# Patient Record
Sex: Female | Born: 1985 | Race: Black or African American | Hispanic: No | Marital: Single | Smoking: Never smoker
Health system: Southern US, Community
[De-identification: ages and names within clinical notes are randomized; demographics above are authoritative.]

## PROBLEM LIST (undated history)

## (undated) ENCOUNTER — Inpatient Hospital Stay (HOSPITAL_COMMUNITY): Payer: Self-pay

## (undated) DIAGNOSIS — E119 Type 2 diabetes mellitus without complications: Secondary | ICD-10-CM

## (undated) DIAGNOSIS — Z8619 Personal history of other infectious and parasitic diseases: Secondary | ICD-10-CM

## (undated) HISTORY — DX: Personal history of other infectious and parasitic diseases: Z86.19

## (undated) HISTORY — PX: THROAT SURGERY: SHX803

## (undated) HISTORY — DX: Type 2 diabetes mellitus without complications: E11.9

---
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
## 2006-03-05 ENCOUNTER — Emergency Department (HOSPITAL_COMMUNITY): Admission: EM | Admit: 2006-03-05 | Discharge: 2006-03-05 | Payer: Self-pay | Admitting: Emergency Medicine

## 2007-04-03 IMAGING — US US OB COMP LESS 14 WK
1 series · 13 of 28 positions shown · non-contrast
Comparison: none

HISTORY: Early pregnancy, pain and bleeding, no quantitative beta-hCG level for
correlation

[Series 1: us ob comp less 14 wk · 0.24mm/px · 13 of 31 slices shown]
[im 2/31]
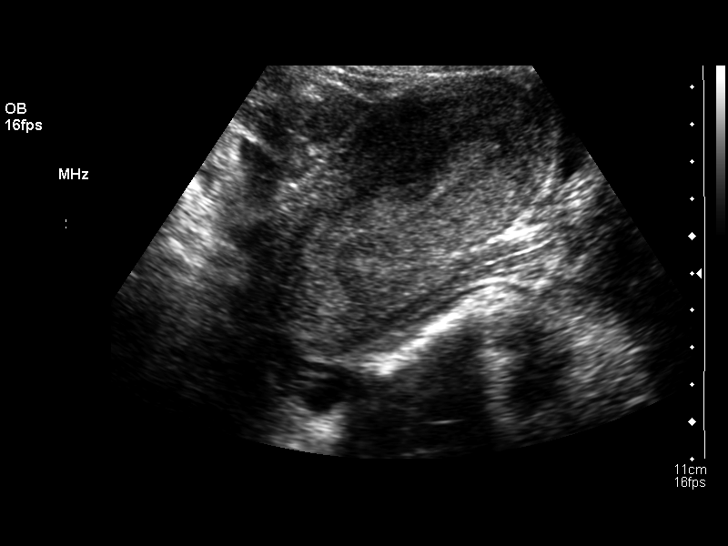
[im 4/31]
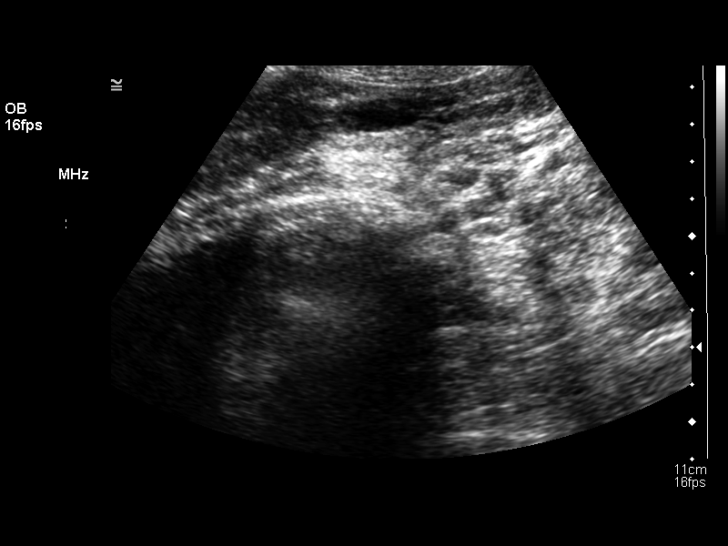
[im 6/31]
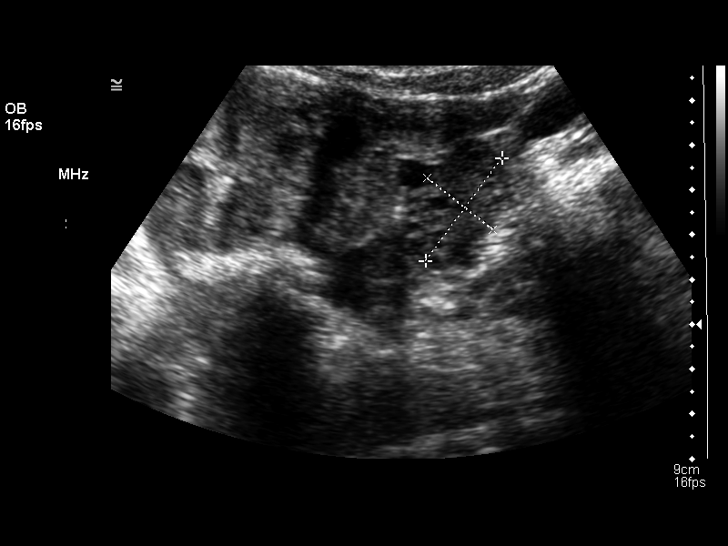
[im 8/31]
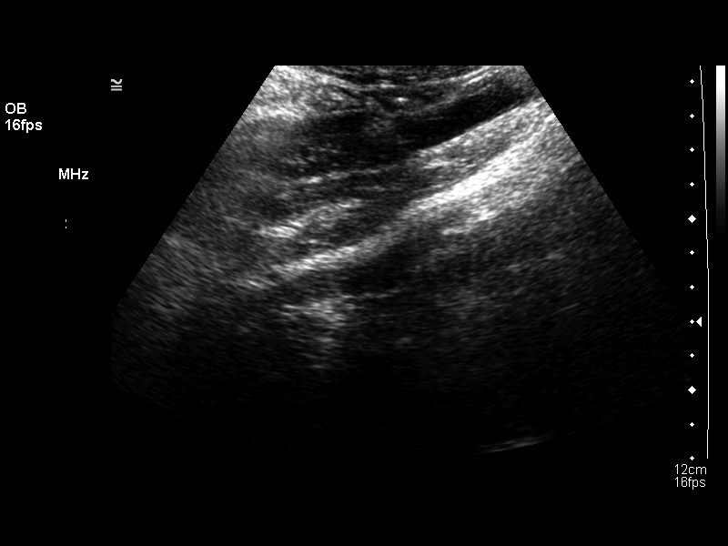
[im 11/31]
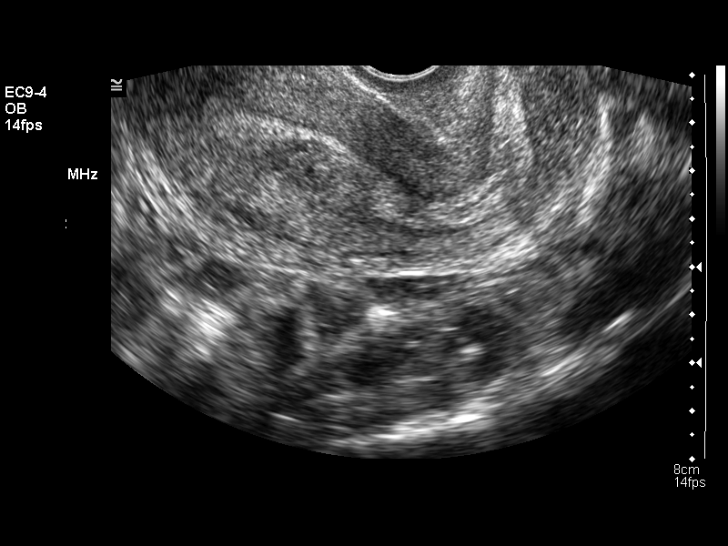
[im 13/31]
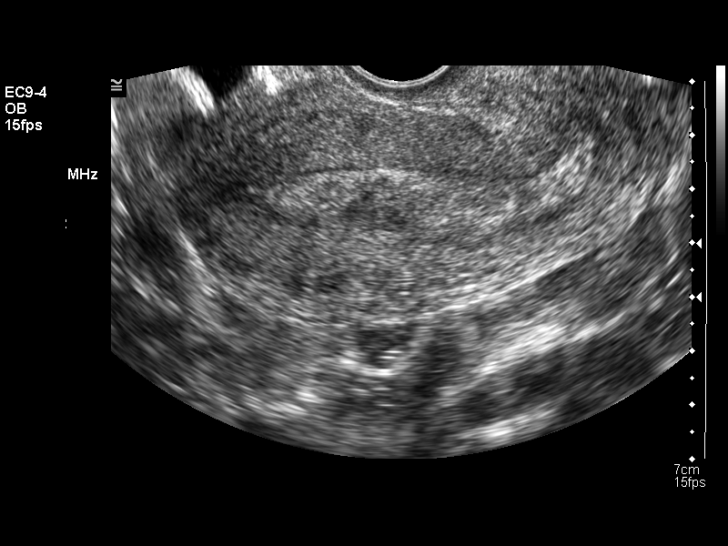
[im 16/31]
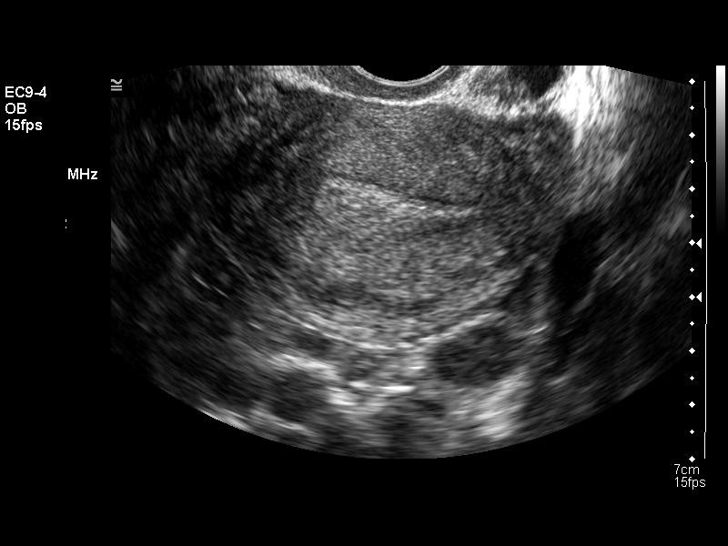
[im 18/31]
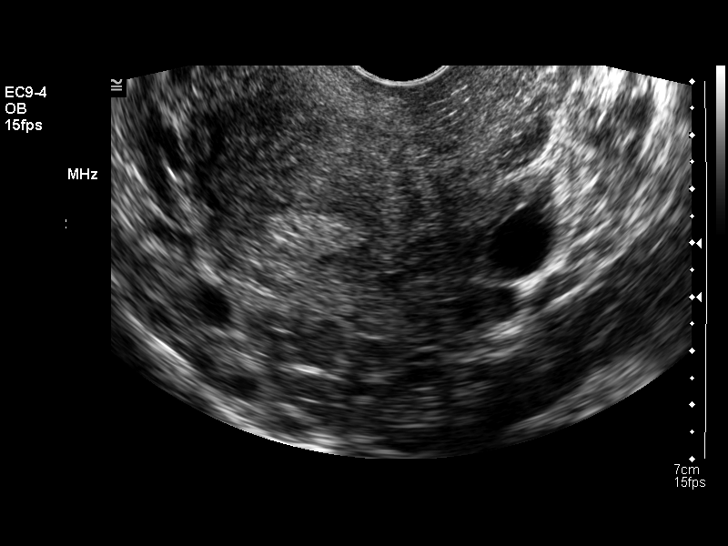
[im 21/31]
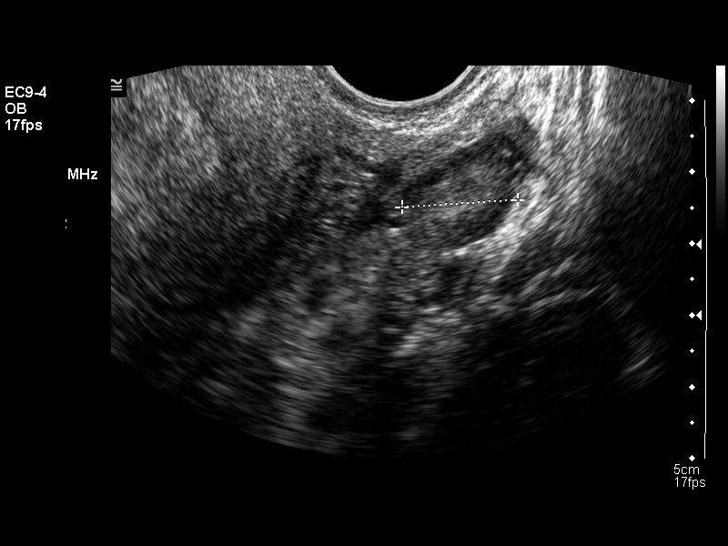
[im 23/31]
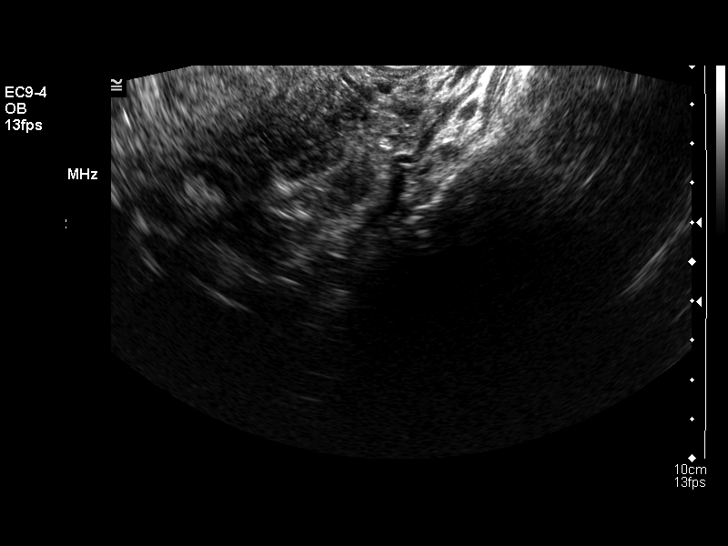
[im 25/31]
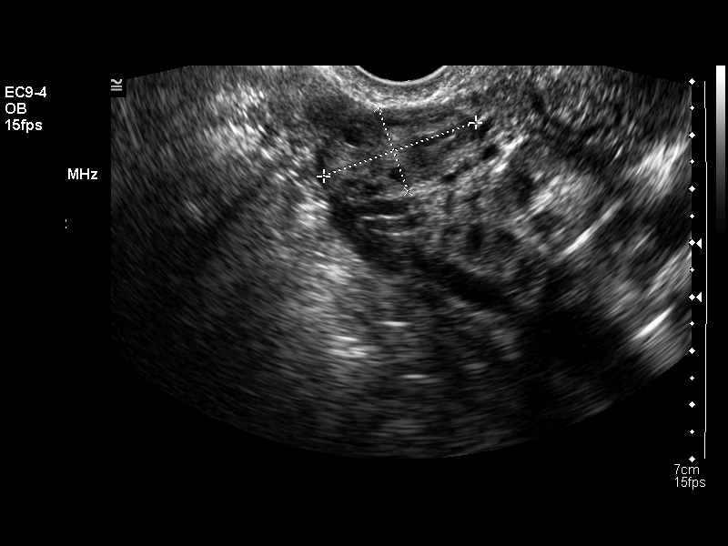
[im 27/31]
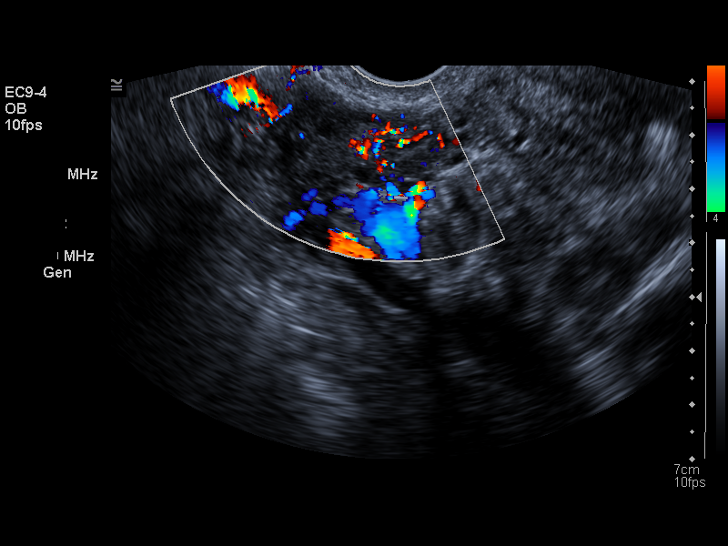
[im 29/31]
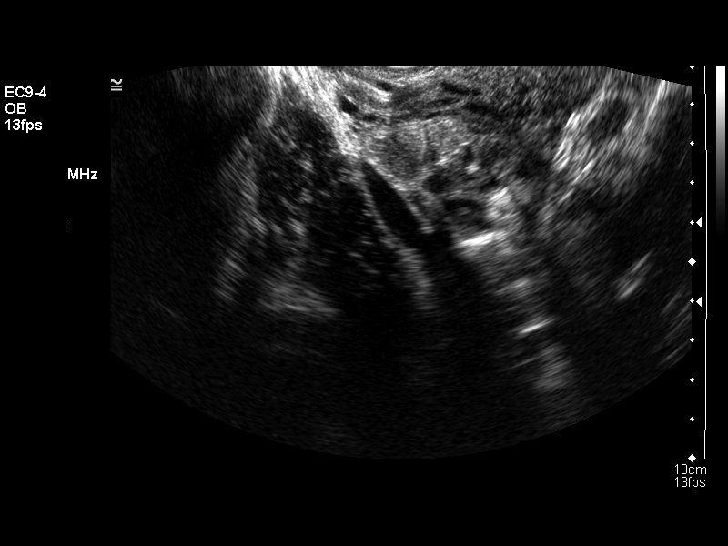

[13 of 28 positions shown; findings below may reference images not displayed]

ULTRASOUND OB COMPLETE LESS THAN 14 WEEKS:
ULTRASOUND OF THE TRANSVAGINAL MODIFY:

Transabdominal and endovaginal sonography of the gravid pelvis performed.

Uterus anteverted.
Uterine length uncertain with AP diameter 4.5 cm and transverse diameter 6.8 cm.
Endometrial stripe thickened, approximately 12 mm in greatest diameter.
Endometrial stripe appears heterogeneous and contains a hypoechoic focus at its
midportion, question small amount of blood.
No evidence of uterine mass or intrauterine gestation.

Small amount of nonspecific free pelvic fluid.
Left ovary normal size and morphology, 2.9 x 1.2 x 1.6 cm.
Right ovary normal size and morphology, 3.0 x 1.7 x 1.9 cm.
No adnexal masses.
IMPRESSION: Slight thickened and heterogeneous endometrial stripe, questionably containing a
small amount of blood.
No intrauterine or extrauterine gestational sac identified.
In the absence of a quantitative beta-hCG level, findings are nonspecific.
Differential diagnosis includes early intrauterine pregnancy too early to
visualize, spontaneous abortion, and ectopic pregnancy.
Recommend correlation with a quantitative beta-hCG level at this time.
Serial quantitative beta-hCG and or followup ultrasound recommended to exclude
ectopic pregnancy.

## 2007-12-20 ENCOUNTER — Ambulatory Visit: Payer: Self-pay | Admitting: Obstetrics & Gynecology

## 2007-12-20 ENCOUNTER — Ambulatory Visit (HOSPITAL_COMMUNITY): Admission: RE | Admit: 2007-12-20 | Discharge: 2007-12-20 | Payer: Self-pay | Admitting: Family Medicine

## 2007-12-20 ENCOUNTER — Encounter: Payer: Self-pay | Admitting: Family

## 2007-12-20 ENCOUNTER — Other Ambulatory Visit: Payer: Self-pay | Admitting: Obstetrics & Gynecology

## 2007-12-26 ENCOUNTER — Ambulatory Visit: Payer: Self-pay | Admitting: Obstetrics & Gynecology

## 2008-01-03 ENCOUNTER — Ambulatory Visit: Payer: Self-pay | Admitting: Obstetrics & Gynecology

## 2008-01-17 ENCOUNTER — Ambulatory Visit: Payer: Self-pay | Admitting: Obstetrics & Gynecology

## 2008-01-31 ENCOUNTER — Ambulatory Visit: Payer: Self-pay | Admitting: Obstetrics & Gynecology

## 2008-02-14 ENCOUNTER — Ambulatory Visit: Payer: Self-pay | Admitting: Obstetrics & Gynecology

## 2008-03-06 ENCOUNTER — Ambulatory Visit: Payer: Self-pay | Admitting: Obstetrics & Gynecology

## 2008-03-13 ENCOUNTER — Ambulatory Visit: Payer: Self-pay | Admitting: Obstetrics & Gynecology

## 2008-03-16 ENCOUNTER — Ambulatory Visit: Payer: Self-pay | Admitting: Advanced Practice Midwife

## 2008-03-16 ENCOUNTER — Inpatient Hospital Stay (HOSPITAL_COMMUNITY): Admission: AD | Admit: 2008-03-16 | Discharge: 2008-03-16 | Payer: Self-pay | Admitting: Family Medicine

## 2008-03-20 ENCOUNTER — Ambulatory Visit: Payer: Self-pay | Admitting: Obstetrics & Gynecology

## 2008-03-20 ENCOUNTER — Inpatient Hospital Stay (HOSPITAL_COMMUNITY): Admission: AD | Admit: 2008-03-20 | Discharge: 2008-03-23 | Payer: Self-pay | Admitting: Obstetrics & Gynecology

## 2008-03-20 ENCOUNTER — Ambulatory Visit: Payer: Self-pay | Admitting: Family

## 2010-07-04 ENCOUNTER — Emergency Department (HOSPITAL_COMMUNITY)
Admission: EM | Admit: 2010-07-04 | Discharge: 2010-07-04 | Payer: Self-pay | Source: Home / Self Care | Admitting: Emergency Medicine

## 2010-07-09 ENCOUNTER — Emergency Department (HOSPITAL_COMMUNITY)
Admission: EM | Admit: 2010-07-09 | Discharge: 2010-07-09 | Payer: Self-pay | Source: Home / Self Care | Admitting: Emergency Medicine

## 2010-07-13 LAB — ETHANOL: Alcohol, Ethyl (B): <5 mg/dL (ref 0–10)

## 2010-07-13 LAB — BASIC METABOLIC PANEL
BUN: 8 mg/dL (ref 6–23)
CO2: 24 mEq/L (ref 19–32)
Calcium: 9.1 mg/dL (ref 8.4–10.5)
Chloride: 104 mEq/L (ref 96–112)
Creatinine, Ser: 0.68 mg/dL (ref 0.4–1.2)
GFR calc Af Amer: >60 mL/min (ref >60)
GFR calc non Af Amer: >60 mL/min (ref >60)
Glucose, Bld: 97 mg/dL (ref 70–99)
Potassium: 3.8 mEq/L (ref 3.5–5.1)
Sodium: 136 mEq/L (ref 135–145)

## 2010-07-13 LAB — URINE CULTURE
Colony Count: >=100000
Culture  Setup Time: 201201080026

## 2010-07-13 LAB — DIFFERENTIAL
Basophils Absolute: 0 10*3/uL (ref 0.0–0.1)
Basophils Relative: 0 % (ref 0–1)
Eosinophils Absolute: 0 10*3/uL (ref 0.0–0.7)
Eosinophils Relative: 0 % (ref 0–5)
Lymphocytes Relative: 15 % (ref 12–46)
Lymphs Abs: 1.3 10*3/uL (ref 0.7–4.0)
Monocytes Absolute: 1.1 10*3/uL — ABNORMAL HIGH (ref 0.1–1.0)
Monocytes Relative: 13 % — ABNORMAL HIGH (ref 3–12)
Neutro Abs: 6.2 10*3/uL (ref 1.7–7.7)
Neutrophils Relative %: 71 % (ref 43–77)

## 2010-07-13 LAB — URINALYSIS, ROUTINE W REFLEX MICROSCOPIC
Bilirubin Urine: NEGATIVE
Ketones, ur: NEGATIVE mg/dL
Nitrite: NEGATIVE
Protein, ur: NEGATIVE mg/dL
Specific Gravity, Urine: 1.022 (ref 1.005–1.030)
Urine Glucose, Fasting: NEGATIVE mg/dL
Urobilinogen, UA: 1 mg/dL (ref 0.0–1.0)
pH: 7 (ref 5.0–8.0)

## 2010-07-13 LAB — CBC
HCT: 41.4 % (ref 36.0–46.0)
Hemoglobin: 13.7 g/dL (ref 12.0–15.0)
MCH: 28 pg (ref 26.0–34.0)
MCHC: 33.1 g/dL (ref 30.0–36.0)
MCV: 84.7 fL (ref 78.0–100.0)
Platelets: 325 10*3/uL (ref 150–400)
RBC: 4.89 MIL/uL (ref 3.87–5.11)
RDW: 13.1 % (ref 11.5–15.5)
WBC: 8.7 10*3/uL (ref 4.0–10.5)

## 2010-07-13 LAB — URINE MICROSCOPIC-ADD ON

## 2010-07-13 LAB — PREGNANCY, URINE: Preg Test, Ur: NEGATIVE

## 2010-11-13 NOTE — Op Note (Signed)
NAMECLATIE, Alison Stone             ACCOUNT NO.:  [id number]   MEDICAL RECORD NO.:  [id number]          PATIENT TYPE:  AMB   LOCATION:  SDS                          FACILITY:  [facility]   PHYSICIAN:  Antony Contras, MD     DATE OF BIRTH:  [birth year]   DATE OF PROCEDURE:  09/30/2005  DATE OF DISCHARGE:  09/30/2005                                 OPERATIVE REPORT   PREOPERATIVE DIAGNOSIS:  Right peritonsillar abscess.   POSTOP DIAGNOSIS:  Right peritonsillar abscess.   PROCEDURE:  Incision and drainage of right peritonsillar abscess.   SURGEON:  Dr. Christia Reading   ANESTHESIA:  General endotracheal anesthesia.   COMPLICATIONS:  None.   INDICATIONS:  The patient is a 25 year old African-American female with a 6-  day history of sore throat who presented to the emergency department this  morning.  A CT scan was performed due to limited exam and demonstrated a  right peritonsillar fluid collection.  She has a history of previous right  peritonsillar infection requiring I and D two years ago.  After discussing  the options with her and her mother including incision and drainage versus  Quinsy tonsillectomy, she is to undergo incision and drainage due to the  longer recovery with tonsillectomy.  She plans to have tonsils removed in  the future.  Incision and drainage was attempted emergency department the  patient did not tolerate this due to trismus and pain.  She thus presents to  the operating room for surgical management.   FINDINGS:  The right peritonsillar region was markedly full with the pushing  of the uvula toward the left side.  Upon injection of local anesthetic,  cheesy yellow material came from the superior tonsillar pole.  Upon incision  and dissection, a pocket of the yellow pus was entered and drained.   DESCRIPTION OF PROCEDURE:  The patient is identified in the holding room and  informed consent having been obtained, the patient was moved to the  operating suite and  put on the operating table in supine position.  Anesthesia was induced.  The patient was intubated by the anesthesia team  without difficulty.  The patient given intravenous antibiotics, steroids  during the case.  The eyes were taped closed and bed was turned 90 degrees  from anesthesia.  The Crowe-Davis retractor was inserted to view the  oropharynx and this was placed in suspension on the Mayo stand.  The mucosa  superior to the right tonsil was injected with 1% lidocaine with 1:100,000  epinephrine.  An arcing incision was then made above the tonsil using  electrocautery the mucosa and submucosal tissues.  Hemostat was then used to  dissect in the deeper tissues and the pus pocket was entered posterior to  the tonsil.  The pus was drained out and then pressed out by pressing on the  tonsil.  The site was then copiously irrigated with saline and the  mouth and esophagus were suctioned out.  The Crowe-Davis retractor then  taken out of suspension and removed from the patient's mouth.  She was then  turned back to Anesthesia  for wake-up was extubated and moved to recovery  room in stable condition.      Antony Contras, MD  Electronically Signed     DDB/MEDQ  D:  09/30/2005  T:  10/01/2005  Job:  [id number]

## 2010-11-13 NOTE — Consult Note (Signed)
NAME:  Alison Stone, Alison Stone                       ACCOUNT NO.:  [id number]   MEDICAL RECORD NO.:  [id number]                   PATIENT TYPE:  EMS   LOCATION:  ED                                   FACILITY:  [facility]   PHYSICIAN:  Jefry H. Pollyann Kennedy, M.D.                DATE OF BIRTH:  06/06/1986   DATE OF CONSULTATION:  DATE OF DISCHARGE:  08/05/2003                                   CONSULTATION   OTORHINOLARYNGOLOGY CONSULTATION:   REASON FOR CONSULTATION:  Peritonsillar abscess.   HISTORY:  This is a 25 year old female who has a 10-day history of severe  progressively worsening sore throat that has localized to the right side.  No prior history of tonsillar disease.  She has no medical history, no  surgical history, takes no medication, has no known drug allergies, does not  smoke or use alcohol or drugs.  She is currently a high Ecologist.  No  primary care physician of record.   EXAMINATION:  She is an ill-appearing young lady right now somewhat sleepy  secondary to a dose of Dilaudid that was administered for pain relief.  There is some tender right upper anterior jugular adenopathy.  The nasal  exam is clear.  Oral cavity and pharynx reveals full and healthy dentition,  significant trismus.  The right soft palate is severely swollen and with  erythema.  There is no exudate seen.  The tonsils are very difficult to  visualize.   Clinical history and exam suspicious for right peritonsillar abscess.   PROCEDURE NOTE:  One percent Xylocaine with epinephrine was infiltrated into  the right soft palate.  An 18-guage needle was used to aspirate from the  peritonsillar space a large amount of thick slightly brownish-tan colored  purulent material.  A 3 mL syringe was filled with pus and then emptied and  partially filled again.  A #15 scalpel was used to the incise the mucosa and  a large amount of pus was then expressed.  A long tonsillar hemostat was  used to spread the peritonsillar  tissue and further purulence was produced.  Suction was used to clean out all of the secretions.  There was minimal  bleeding.  She tolerated this well.   Status post incision and drainage of the right peritonsillar abscess.  She  will be administered 1 L of intravenous fluids for rehydration.  She will be  given 1.2 units of Bicillin L-A since she is currently not able to swallow  medication.  She is given a prescription for Augmentin suspension and Lortab  elixir to use for pain and for antibiotic for 10 days.  She will contact our  office tomorrow for followup.  She will be discharged to home when finished  with the intravenous medication and fluids.  Jefry H. Pollyann Kennedy, M.D.    JHR/MEDQ  D:  08/04/2003  T:  08/05/2003  Job:  [id number]

## 2011-02-24 LAB — HEPATITIS B SURFACE ANTIGEN: Hepatitis B Surface Ag: NEGATIVE

## 2011-02-24 LAB — ABO/RH: RH Type: POSITIVE

## 2011-02-24 LAB — GC/CHLAMYDIA PROBE AMP, GENITAL: Chlamydia: NEGATIVE

## 2011-02-24 LAB — RUBELLA ANTIBODY, IGM: Rubella: IMMUNE

## 2011-03-25 ENCOUNTER — Other Ambulatory Visit: Payer: Self-pay | Admitting: Obstetrics & Gynecology

## 2011-03-25 DIAGNOSIS — Z3689 Encounter for other specified antenatal screening: Secondary | ICD-10-CM

## 2011-03-25 LAB — POCT URINALYSIS DIP (DEVICE)
Bilirubin Urine: NEGATIVE
Bilirubin Urine: NEGATIVE
Glucose, UA: NEGATIVE
Glucose, UA: NEGATIVE
Hgb urine dipstick: NEGATIVE
Hgb urine dipstick: NEGATIVE
Ketones, ur: NEGATIVE
Ketones, ur: NEGATIVE
Nitrite: NEGATIVE
Nitrite: NEGATIVE
Operator id: 297281
Operator id: 297281
Protein, ur: NEGATIVE
Protein, ur: NEGATIVE
Specific Gravity, Urine: 1.015
Specific Gravity, Urine: 1.015
Urobilinogen, UA: 0.2
Urobilinogen, UA: 0.2
pH: 7
pH: 8.5 — ABNORMAL HIGH

## 2011-03-26 LAB — POCT URINALYSIS DIP (DEVICE)
Bilirubin Urine: NEGATIVE
Ketones, ur: NEGATIVE
Ketones, ur: NEGATIVE
Operator id: 148111
Operator id: 297281
Specific Gravity, Urine: 1.01
pH: 6

## 2011-03-29 ENCOUNTER — Other Ambulatory Visit: Payer: Self-pay | Admitting: Obstetrics & Gynecology

## 2011-03-29 DIAGNOSIS — R9389 Abnormal findings on diagnostic imaging of other specified body structures: Secondary | ICD-10-CM

## 2011-03-29 LAB — POCT URINALYSIS DIP (DEVICE)
Bilirubin Urine: NEGATIVE
Bilirubin Urine: NEGATIVE
Glucose, UA: NEGATIVE
Ketones, ur: NEGATIVE
Ketones, ur: NEGATIVE
Operator id: 15968
Operator id: 200901
Protein, ur: NEGATIVE

## 2011-03-29 LAB — CBC
HCT: 29.2 — ABNORMAL LOW
HCT: 41.7
Hemoglobin: 11.4 — ABNORMAL LOW
Hemoglobin: 13.4
MCHC: 33.3
MCV: 88.9
Platelets: 180
Platelets: 211
RBC: 3.93
WBC: 15.8 — ABNORMAL HIGH
WBC: 19.9 — ABNORMAL HIGH

## 2011-03-30 ENCOUNTER — Ambulatory Visit (HOSPITAL_COMMUNITY): Admission: RE | Admit: 2011-03-30 | Payer: Medicaid Other | Source: Ambulatory Visit

## 2011-03-31 LAB — POCT URINALYSIS DIP (DEVICE)
Hgb urine dipstick: NEGATIVE
Ketones, ur: NEGATIVE
Protein, ur: 30 — AB
pH: 7

## 2011-04-01 ENCOUNTER — Ambulatory Visit (HOSPITAL_COMMUNITY): Payer: Medicaid Other

## 2011-04-02 ENCOUNTER — Other Ambulatory Visit (HOSPITAL_COMMUNITY): Payer: Medicaid Other

## 2011-04-02 ENCOUNTER — Ambulatory Visit (HOSPITAL_COMMUNITY)
Admission: RE | Admit: 2011-04-02 | Discharge: 2011-04-02 | Disposition: A | Discharge status: Home / Self Care | Payer: Medicaid Other | Source: Ambulatory Visit | Attending: Obstetrics & Gynecology | Admitting: Obstetrics & Gynecology

## 2011-04-02 ENCOUNTER — Encounter (HOSPITAL_COMMUNITY): Payer: Self-pay

## 2011-04-02 DIAGNOSIS — R9389 Abnormal findings on diagnostic imaging of other specified body structures: Secondary | ICD-10-CM

## 2011-04-02 DIAGNOSIS — Z1389 Encounter for screening for other disorder: Secondary | ICD-10-CM | POA: Insufficient documentation

## 2011-04-02 DIAGNOSIS — Z363 Encounter for antenatal screening for malformations: Secondary | ICD-10-CM | POA: Insufficient documentation

## 2011-04-02 DIAGNOSIS — O358XX0 Maternal care for other (suspected) fetal abnormality and damage, not applicable or unspecified: Secondary | ICD-10-CM | POA: Insufficient documentation

## 2011-04-02 NOTE — Progress Notes (Signed)
See ultrasound report in [facility] 

## 2011-04-05 ENCOUNTER — Encounter (HOSPITAL_COMMUNITY): Payer: Self-pay

## 2011-04-13 ENCOUNTER — Encounter (HOSPITAL_COMMUNITY): Payer: Self-pay | Admitting: Obstetrics & Gynecology

## 2011-04-14 ENCOUNTER — Encounter (HOSPITAL_COMMUNITY): Payer: Self-pay

## 2011-04-30 ENCOUNTER — Ambulatory Visit (HOSPITAL_COMMUNITY)
Admission: RE | Admit: 2011-04-30 | Discharge: 2011-04-30 | Disposition: A | Discharge status: Home / Self Care | Payer: Medicaid Other | Source: Ambulatory Visit | Attending: Obstetrics & Gynecology | Admitting: Obstetrics & Gynecology

## 2011-04-30 ENCOUNTER — Encounter (HOSPITAL_COMMUNITY): Payer: Self-pay

## 2011-04-30 DIAGNOSIS — O358XX0 Maternal care for other (suspected) fetal abnormality and damage, not applicable or unspecified: Secondary | ICD-10-CM | POA: Insufficient documentation

## 2011-04-30 DIAGNOSIS — R9389 Abnormal findings on diagnostic imaging of other specified body structures: Secondary | ICD-10-CM

## 2011-04-30 DIAGNOSIS — Z3689 Encounter for other specified antenatal screening: Secondary | ICD-10-CM | POA: Insufficient documentation

## 2011-06-29 NOTE — L&D Delivery Note (Signed)
Delivery Note At 1:55 PM a viable female was delivered via Vaginal, Spontaneous Delivery (Presentation:ROA ;  ).  APGAR:8 -9 , ; weight 8 lb 2.5 oz (3700 g).   Placenta status: Intact, Spontaneous Pathology.  Cord: 3 vessels with the following complications: None.  Cord pH: none  Anesthesia:   Episiotomy:  Lacerations:  Suture Repair: n/a Est. Blood Loss (mL):   Mom to postpartum.  Baby to nursery-stable.  Alison Stone A 08/14/2011, 2:19 PM

## 2011-08-12 ENCOUNTER — Encounter (HOSPITAL_COMMUNITY): Payer: Self-pay | Admitting: *Deleted

## 2011-08-12 ENCOUNTER — Other Ambulatory Visit: Payer: Self-pay | Admitting: Obstetrics

## 2011-08-12 ENCOUNTER — Telehealth (HOSPITAL_COMMUNITY): Payer: Self-pay | Admitting: *Deleted

## 2011-08-12 NOTE — Telephone Encounter (Signed)
Preadmission screen  

## 2011-08-14 ENCOUNTER — Inpatient Hospital Stay (HOSPITAL_COMMUNITY)
Admission: EM | Admit: 2011-08-14 | Discharge: 2011-08-16 | DRG: 775 | Disposition: A | Discharge status: Home / Self Care | Payer: Medicaid Other | Attending: Obstetrics | Admitting: Obstetrics

## 2011-08-14 ENCOUNTER — Encounter (HOSPITAL_COMMUNITY): Payer: Self-pay | Admitting: Emergency Medicine

## 2011-08-14 ENCOUNTER — Other Ambulatory Visit: Payer: Self-pay | Admitting: Obstetrics

## 2011-08-14 DIAGNOSIS — IMO0001 Reserved for inherently not codable concepts without codable children: Secondary | ICD-10-CM

## 2011-08-14 LAB — CBC
HCT: 35.3 % — ABNORMAL LOW (ref 36.0–46.0)
MCHC: 30 g/dL (ref 30.0–36.0)
RDW: 17.9 % — ABNORMAL HIGH (ref 11.5–15.5)
WBC: 12.9 10*3/uL — ABNORMAL HIGH (ref 4.0–10.5)

## 2011-08-14 LAB — RPR: RPR Ser Ql: NONREACTIVE

## 2011-08-14 MED ORDER — NALBUPHINE SYRINGE 5 MG/0.5 ML
INJECTION | INTRAMUSCULAR | Status: AC
  Administered 2011-08-14: 10 mg
  Filled 2011-08-14: qty 1

## 2011-08-14 MED ORDER — OXYTOCIN BOLUS FROM INFUSION
500.0000 mL | Freq: Once | INTRAVENOUS | Status: DC
  Filled 2011-08-14: qty 500

## 2011-08-14 MED ORDER — OXYTOCIN 20 UNITS IN LACTATED RINGERS INFUSION - SIMPLE
125.0000 mL/h | INTRAVENOUS | Status: DC | PRN
  Administered 2011-08-14: 125 mL/h via INTRAVENOUS

## 2011-08-14 MED ORDER — SIMETHICONE 80 MG PO CHEW: 80.0000 mg | CHEWABLE_TABLET | ORAL | Status: DC | PRN

## 2011-08-14 MED ORDER — LIDOCAINE HCL (PF) 1 % IJ SOLN
30.0000 mL | INTRAMUSCULAR | Status: DC | PRN
  Filled 2011-08-14: qty 30

## 2011-08-14 MED ORDER — FLEET ENEMA 7-19 GM/118ML RE ENEM: 1.0000 | ENEMA | RECTAL | Status: DC | PRN

## 2011-08-14 MED ORDER — SENNOSIDES-DOCUSATE SODIUM 8.6-50 MG PO TABS
2.0000 | ORAL_TABLET | Freq: Every day | ORAL | Status: DC
  Administered 2011-08-14: 2 via ORAL

## 2011-08-14 MED ORDER — PRENATAL MULTIVITAMIN CH
1.0000 | ORAL_TABLET | Freq: Every day | ORAL | Status: DC
  Administered 2011-08-15: 1 via ORAL
  Filled 2011-08-14 (×2): qty 1

## 2011-08-14 MED ORDER — DIPHENHYDRAMINE HCL 25 MG PO CAPS: 25.0000 mg | ORAL_CAPSULE | Freq: Four times a day (QID) | ORAL | Status: DC | PRN

## 2011-08-14 MED ORDER — LANOLIN HYDROUS EX OINT: TOPICAL_OINTMENT | CUTANEOUS | Status: DC | PRN

## 2011-08-14 MED ORDER — WITCH HAZEL-GLYCERIN EX PADS: 1.0000 "application " | MEDICATED_PAD | CUTANEOUS | Status: DC | PRN

## 2011-08-14 MED ORDER — CITRIC ACID-SODIUM CITRATE 334-500 MG/5ML PO SOLN: 30.0000 mL | ORAL | Status: DC | PRN

## 2011-08-14 MED ORDER — LACTATED RINGERS IV SOLN
INTRAVENOUS | Status: DC
  Administered 2011-08-14: 12:00:00 via INTRAVENOUS

## 2011-08-14 MED ORDER — BENZOCAINE-MENTHOL 20-0.5 % EX AERO
INHALATION_SPRAY | CUTANEOUS | Status: AC
  Filled 2011-08-14: qty 56

## 2011-08-14 MED ORDER — BENZOCAINE-MENTHOL 20-0.5 % EX AERO
1.0000 "application " | INHALATION_SPRAY | CUTANEOUS | Status: DC | PRN
  Administered 2011-08-16: 1 via TOPICAL
  Filled 2011-08-14: qty 56

## 2011-08-14 MED ORDER — TETANUS-DIPHTH-ACELL PERTUSSIS 5-2.5-18.5 LF-MCG/0.5 IM SUSP: 0.5000 mL | Freq: Once | INTRAMUSCULAR | Status: DC

## 2011-08-14 MED ORDER — OXYCODONE-ACETAMINOPHEN 5-325 MG PO TABS: 1.0000 | ORAL_TABLET | ORAL | Status: DC | PRN

## 2011-08-14 MED ORDER — OXYCODONE-ACETAMINOPHEN 5-325 MG/5ML PO SOLN
7.5000 mL | ORAL | Status: DC | PRN
  Administered 2011-08-14: 7.5 mL via ORAL
  Administered 2011-08-15 (×2): 5 mL via ORAL
  Filled 2011-08-14 (×2): qty 5

## 2011-08-14 MED ORDER — ONDANSETRON HCL 4 MG/2ML IJ SOLN: 4.0000 mg | Freq: Four times a day (QID) | INTRAMUSCULAR | Status: DC | PRN

## 2011-08-14 MED ORDER — ONDANSETRON HCL 4 MG PO TABS: 4.0000 mg | ORAL_TABLET | ORAL | Status: DC | PRN

## 2011-08-14 MED ORDER — IBUPROFEN 600 MG PO TABS
600.0000 mg | ORAL_TABLET | Freq: Four times a day (QID) | ORAL | Status: DC
  Administered 2011-08-15: 600 mg via ORAL

## 2011-08-14 MED ORDER — ONDANSETRON HCL 4 MG/2ML IJ SOLN: 4.0000 mg | INTRAMUSCULAR | Status: DC | PRN

## 2011-08-14 MED ORDER — MEDROXYPROGESTERONE ACETATE 150 MG/ML IM SUSP: 150.0000 mg | INTRAMUSCULAR | Status: DC | PRN

## 2011-08-14 MED ORDER — OXYTOCIN 10 UNIT/ML IJ SOLN
40.0000 [IU] | Freq: Once | INTRAVENOUS | Status: AC
  Administered 2011-08-14: 40 [IU] via INTRAVENOUS
  Filled 2011-08-14: qty 4

## 2011-08-14 MED ORDER — LACTATED RINGERS IV SOLN: 500.0000 mL | INTRAVENOUS | Status: DC | PRN

## 2011-08-14 MED ORDER — NALBUPHINE SYRINGE 5 MG/0.5 ML
10.0000 mg | INJECTION | INTRAMUSCULAR | Status: DC | PRN
  Filled 2011-08-14: qty 1

## 2011-08-14 MED ORDER — ZOLPIDEM TARTRATE 5 MG PO TABS: 5.0000 mg | ORAL_TABLET | Freq: Every evening | ORAL | Status: DC | PRN

## 2011-08-14 MED ORDER — OXYTOCIN 20 UNITS IN LACTATED RINGERS INFUSION - SIMPLE
125.0000 mL/h | Freq: Once | INTRAVENOUS | Status: AC
  Administered 2011-08-14: 999 mL/h via INTRAVENOUS
  Filled 2011-08-14: qty 1000

## 2011-08-14 MED ORDER — METHYLERGONOVINE MALEATE 0.2 MG PO TABS: 0.2000 mg | ORAL_TABLET | ORAL | Status: DC | PRN

## 2011-08-14 MED ORDER — METHYLERGONOVINE MALEATE 0.2 MG/ML IJ SOLN: 0.2000 mg | INTRAMUSCULAR | Status: DC | PRN

## 2011-08-14 MED ORDER — DIBUCAINE 1 % RE OINT: 1.0000 "application " | TOPICAL_OINTMENT | RECTAL | Status: DC | PRN

## 2011-08-14 MED ORDER — ACETAMINOPHEN 325 MG PO TABS: 650.0000 mg | ORAL_TABLET | ORAL | Status: DC | PRN

## 2011-08-14 MED ORDER — IBUPROFEN 600 MG PO TABS: 600.0000 mg | ORAL_TABLET | Freq: Four times a day (QID) | ORAL | Status: DC | PRN

## 2011-08-14 MED ORDER — IBUPROFEN 100 MG/5ML PO SUSP
800.0000 mg | Freq: Three times a day (TID) | ORAL | Status: DC
  Administered 2011-08-14 – 2011-08-16 (×4): 800 mg via ORAL
  Filled 2011-08-14 (×7): qty 40

## 2011-08-14 NOTE — ED Notes (Signed)
Pt reports feels like she needs to have a bowel movement every 5 mins.

## 2011-08-14 NOTE — Progress Notes (Signed)
Alison Stone is a 26 y.o. [phone] at [redacted]w[redacted]d by LMP admitted for active labor  Subjective:   Objective: BP 143/63  Pulse 100  Temp(Src) 98.4 F (36.9 C) (Oral)  Resp 10  SpO2 100%  LMP 10/20/2010      FHT:  FHR: 150 bpm, variability: moderate,  accelerations:  Present,  decelerations:  Absent UC:   regular, every 3 minutes SVE:   Dilation: 8  Labs: Lab Results  Component Value Date   WBC 8.7 07/04/2010   HGB 13.7 07/04/2010   HCT 41.4 07/04/2010   MCV 84.7 07/04/2010   PLT 325 07/04/2010    Assessment / Plan: Spontaneous labor, progressing normally  Labor: Progressing normally Preeclampsia:  none Fetal Wellbeing:  Category I Pain Control:  Labor support without medications I/D:  n/a Anticipated MOD:  NSVD  HARPER,CHARLES A 08/14/2011, 12:00 PM

## 2011-08-14 NOTE — ED Provider Notes (Signed)
History     CSN: [id number]  Arrival date & time 08/14/11  1111   First MD Initiated Contact with Patient 08/14/11 1125      Chief Complaint  Patient presents with  . Abdominal Pain    [redacted] weeks pregnant    HPI G3P2 40 weeks and 6 days.  She reports no loss of fluid.  She's had no vaginal bleeding.  She reports that several hours ago she reported eating at a bowel movement.  She feels like these are contractions.  She's having pain in her back.  Her other deliveries were vaginal.  She's had no complications with her other pregnancies.  She is so far had an uneventful pregnancy with her current patient.  Her OB/GYN is Dr. Clearance Coots.  It is unclear why she presented to [facility] department instead of [facility].  She denies fevers and chills.  She denies pain with urination and burning with urination.  She denies nausea and vomiting.  She denies headache.  She denies weakness or dizziness.  She still feels the baby moving.    Past Medical History  Diagnosis Date  . History of chicken pox     Past Surgical History  Procedure Date  . Throat surgery     cyst blocking airway    Family History  Problem Relation Age of Onset  . Asthma Maternal Aunt   . Kidney disease Maternal Aunt   . Migraines Maternal Aunt   . Nephrolithiasis Paternal Aunt   . Migraines Paternal Aunt   . Stroke Maternal Grandfather   . Hypertension Maternal Grandfather   . Heart failure Maternal Grandfather     History  Substance Use Topics  . Smoking status: Never Smoker   . Smokeless tobacco: Never Used  . Alcohol Use: No    OB History    Grav Para Term Preterm Abortions TAB SAB Ect Mult Living   4 2 2  0 1 0 1 0 0 2      Review of Systems  All other systems reviewed and are negative.    Allergies  Review of patient's allergies indicates no known allergies.  Home Medications   Current Outpatient Rx  Name Route Sig Dispense Refill  . PRENATAL VITAMINS PO Oral Take by mouth.         BP 143/63  Pulse 100  Temp(Src) 98.4 F (36.9 C) (Oral)  Resp 10  SpO2 100%  LMP 10/20/2010  Physical Exam  Nursing note and vitals reviewed. Constitutional: She is oriented to person, place, and time. She appears well-developed and well-nourished. No distress.  HENT:  Head: Normocephalic and atraumatic.  Eyes: EOM are normal.  Neck: Normal range of motion.  Cardiovascular: Normal rate, regular rhythm and normal heart sounds.   Pulmonary/Chest: Effort normal and breath sounds normal.  Abdominal: Soft. She exhibits no distension. There is no tenderness.       Gravid uterus.   Genitourinary:       8cm with bulging bag. Bag intact. No vaginal bleeding. No fluid noted between her legs. Fetal HR 140's. Head down fetus  Musculoskeletal: Normal range of motion.  Neurological: She is alert and oriented to person, place, and time.  Skin: Skin is warm and dry.  Psychiatric: She has a normal mood and affect. Judgment normal.    ED Course  Procedures (including critical care time)  CRITICAL CARE Performed by: Lyanne Co Total critical care time: 30 Critical care time was exclusive of separately billable procedures and  treating other patients. Critical care was necessary to treat or prevent imminent or life-threatening deterioration. Critical care was time spent personally by me on the following activities: development of treatment plan with patient and/or surrogate as well as nursing, discussions with consultants, evaluation of patient's response to treatment, examination of patient, obtaining history from patient or surrogate, ordering and performing treatments and interventions, ordering and review of laboratory studies, ordering and review of radiographic studies, pulse oximetry and re-evaluation of patient's condition.   Labs Reviewed - No data to display No results found.   1. Active labor       MDM  11:42 AM [facility]. I don't believe she will  deliver between the ER and wom[facility] hospital. Her bag is intact. She does have active contractions and is post dates. I spoke with Dr Clearance Coots specifically regarding this and he too believes that if we transfer her quickly she will not deliver in route.   11:45 AM [facility] is leaving the emergency department at this time for transfer.  The patient continues to have no loss of fluids.  Her fetal heart tracing just prior to leaving is normal with good variability and heart rate in the 140s.  She still was having active labor pains.  The rapid response OB nurse will ride with the ambulance team to [facility].  Dr. Clearance Coots is awaiting and  Expecting the patient        Lyanne Co, MD 08/14/11 1146

## 2011-08-14 NOTE — Progress Notes (Signed)
External monitors taken off per ED MD. Patient to be transferred to [facility] via [facility]. RROB RN to ride in [facility] with [facility] incase of imminent delivery.

## 2011-08-14 NOTE — Progress Notes (Signed)
Alison Stone is Stone 26 y.o. [phone] at [redacted]w[redacted]d by LMP admitted for active labor  Subjective:   Objective: BP 144/65  Pulse 120  Temp(Src) 98.1 F (36.7 C) (Axillary)  Resp 20  Ht 5' (1.524 m)  Wt 145 lb (65.772 kg)  BMI 28.32 kg/m2  SpO2 100%  LMP 10/20/2010      FHT:  FHR: 150 bpm, variability: moderate,  accelerations:  Present,  decelerations:  Absent UC:   regular, every 2 minutes SVE:   Dilation: 10 Exam by:: H.Mitchell, RN  Labs: Lab Results  Component Value Date   WBC 8.7 07/04/2010   HGB 13.7 07/04/2010   HCT 41.4 07/04/2010   MCV 84.7 07/04/2010   PLT 325 07/04/2010    Assessment / Plan: Spontaneous labor, progressing normally  Labor: Progressing normally Preeclampsia:  n/Stone Fetal Wellbeing:  Category I Pain Control:  Nubain I/D:  n/Stone Anticipated MOD:  NSVD  Alison Stone 08/14/2011, 12:17 PM

## 2011-08-14 NOTE — Progress Notes (Signed)
ED MD at bedside. Dr. Clearance Coots notified by ED MD of transfer to [facility]

## 2011-08-14 NOTE — H&P (Signed)
Alison Stone is a 26 y.o. female presenting for UC's. Maternal Medical History:  Reason for admission: Reason for admission: contractions.  Contractions: Onset was 3-5 hours ago.   Frequency: regular.   Duration is approximately 1 minute.    Fetal activity: Perceived fetal activity is normal.   Last perceived fetal movement was within the past hour.    Prenatal complications: no prenatal complications   OB History    Grav Para Term Preterm Abortions TAB SAB Ect Mult Living   4 2 2  0 1 0 1 0 0 2     Past Medical History  Diagnosis Date  . History of chicken pox    Past Surgical History  Procedure Date  . Throat surgery     cyst blocking airway   Family History: family history includes Asthma in her maternal aunt; Heart failure in her maternal grandfather; Hypertension in her maternal grandfather; Kidney disease in her maternal aunt; Migraines in her maternal aunt and paternal aunt; Nephrolithiasis in her paternal aunt; and Stroke in her maternal grandfather. Social History:  reports that she has never smoked. She has never used smokeless tobacco. She reports that she does not drink alcohol or use illicit drugs.  Review of Systems  All other systems reviewed and are negative.    Dilation: 8 Blood pressure 143/63, pulse 100, temperature 98.4 F (36.9 C), temperature source Oral, resp. rate 10, last menstrual period 10/20/2010, SpO2 100.00%. Maternal Exam:  Uterine Assessment: Contraction strength is firm.  Contraction duration is 1 minute. Contraction frequency is regular.   Abdomen: Patient reports no abdominal tenderness. Fetal presentation: vertex     Physical Exam  Nursing note and vitals reviewed.   Prenatal labs: ABO, Rh: AB/Positive/-- (08/29 0000) Antibody: Negative (08/29 0000) Rubella: Immune (08/29 0000) RPR: Nonreactive (08/29 0000)  HBsAg: Negative (08/29 0000)  HIV: Non-reactive (08/29 0000)  GBS: Negative (01/10 0000)   Assessment/Plan: 40  weeks.  Active labor.  Admit.   Lloyde Ludlam A 08/14/2011, 11:56 AM

## 2011-08-15 LAB — CBC
Hemoglobin: 9.2 g/dL — ABNORMAL LOW (ref 12.0–15.0)
MCV: 76.6 fL — ABNORMAL LOW (ref 78.0–100.0)
Platelets: 219 10*3/uL (ref 150–400)
RBC: 3.97 MIL/uL (ref 3.87–5.11)
WBC: 15.3 10*3/uL — ABNORMAL HIGH (ref 4.0–10.5)

## 2011-08-15 NOTE — Progress Notes (Signed)
Post Parum Day 1 Subjective: no complaints  Objective: Blood pressure 93/63, pulse 88, temperature 98.8 F (37.1 C), temperature source Oral, resp. rate 18, height 5' (1.524 m), weight 145 lb (65.772 kg), last menstrual period 10/20/2010, SpO2 100.00%, unknown if currently breastfeeding.  Physical Exam:  General: alert and no distress Lochia: appropriate Uterine Fundus: firm Incision: healing well DVT Evaluation: No evidence of DVT seen on physical exam.   Basename 08/15/11 0522 08/14/11 1230  HGB 9.2* 10.6*  HCT 30.4* 35.3*    Assessment/Plan: Plan for discharge tomorrow   LOS: 1 day   Nakari Bracknell A 08/15/2011, 10:28 AM

## 2011-08-16 ENCOUNTER — Inpatient Hospital Stay (HOSPITAL_COMMUNITY): Admission: RE | Admit: 2011-08-16 | Payer: Medicaid Other | Source: Ambulatory Visit

## 2011-08-16 MED ORDER — IBUPROFEN 100 MG/5ML PO SUSP: 800.0000 mg | Freq: Three times a day (TID) | ORAL | Status: DC

## 2011-08-16 MED ORDER — BENZOCAINE-MENTHOL 20-0.5 % EX AERO
INHALATION_SPRAY | CUTANEOUS | Status: AC
  Filled 2011-08-16: qty 56

## 2011-08-16 MED ORDER — OXYCODONE-ACETAMINOPHEN 5-325 MG/5ML PO SOLN: 7.5000 mL | ORAL | Status: DC | PRN

## 2011-08-16 MED ORDER — COMPLETENATE 29-1 MG PO CHEW
1.0000 | CHEWABLE_TABLET | Freq: Every day | ORAL | Status: DC
Start: 2011-08-16 — End: 2011-08-16
  Administered 2011-08-16: 1 via ORAL
  Filled 2011-08-16: qty 1

## 2011-08-16 NOTE — Progress Notes (Signed)
Post Partum Day 2 Subjective: no complaints  Objective: Blood pressure 111/71, pulse 93, temperature 98.4 F (36.9 C), temperature source Oral, resp. rate 19, height 5' (1.524 m), weight 145 lb (65.772 kg), last menstrual period 10/20/2010, SpO2 100.00%, unknown if currently breastfeeding.  Physical Exam:  General: alert and no distress Lochia: appropriate Uterine Fundus: firm Incision: healing well DVT Evaluation: No evidence of DVT seen on physical exam.   Basename 08/15/11 0522 08/14/11 1230  HGB 9.2* 10.6*  HCT 30.4* 35.3*    Assessment/Plan: Discharge home   LOS: 2 days   Alison Stone A 08/16/2011, 6:59 AM

## 2011-08-16 NOTE — Progress Notes (Signed)
UR chart review completed.  

## 2011-08-16 NOTE — Discharge Summary (Signed)
Obstetric Discharge Summary Reason for Admission: onset of labor Prenatal Procedures: ultrasound Intrapartum Procedures: spontaneous vaginal delivery Postpartum Procedures: none Complications-Operative and Postpartum: none Hemoglobin  Date Value Range Status  08/15/2011 9.2* 12.0-15.0 (g/dL) Final     HCT  Date Value Range Status  08/15/2011 30.4* 36.0-46.0 (%) Final    Discharge Diagnoses: Term Pregnancy-delivered  Discharge Information: Date: 08/16/2011 Activity: pelvic rest Diet: routine Medications: PNV, Ibuprofen and Percocet Condition: stable Instructions: refer to practice specific booklet Discharge to: home Follow-up Information    Follow up with [name], MD in 6 weeks.   Contact information:   [address] [place] [address] [phone]          Newborn Data: Live born female  Birth Weight: 8 lb 2.5 oz (3700 g) APGAR: 8, 9  Home with mother.  Alison Stone A 08/16/2011, 7:10 AM

## 2013-03-23 ENCOUNTER — Encounter: Payer: Self-pay | Admitting: Advanced Practice Midwife

## 2013-03-23 ENCOUNTER — Ambulatory Visit (INDEPENDENT_AMBULATORY_CARE_PROVIDER_SITE_OTHER): Payer: Medicaid Other | Admitting: Advanced Practice Midwife

## 2013-03-23 ENCOUNTER — Other Ambulatory Visit: Payer: Self-pay | Admitting: *Deleted

## 2013-03-23 VITALS — BP 123/87 | Temp 98.5°F | Wt 108.0 lb

## 2013-03-23 DIAGNOSIS — Z3480 Encounter for supervision of other normal pregnancy, unspecified trimester: Secondary | ICD-10-CM | POA: Insufficient documentation

## 2013-03-23 DIAGNOSIS — O219 Vomiting of pregnancy, unspecified: Secondary | ICD-10-CM

## 2013-03-23 DIAGNOSIS — Z3481 Encounter for supervision of other normal pregnancy, first trimester: Secondary | ICD-10-CM

## 2013-03-23 DIAGNOSIS — Z3201 Encounter for pregnancy test, result positive: Secondary | ICD-10-CM

## 2013-03-23 LAB — OB RESULTS CONSOLE GC/CHLAMYDIA
Chlamydia: NEGATIVE
GC PROBE AMP, GENITAL: NEGATIVE

## 2013-03-23 LAB — POCT URINALYSIS DIPSTICK
Bilirubin, UA: NEGATIVE
Blood, UA: NEGATIVE
Protein, UA: NEGATIVE
pH, UA: 6

## 2013-03-23 MED ORDER — DOXYLAMINE-PYRIDOXINE 10-10 MG PO TBEC: 20.0000 mg | DELAYED_RELEASE_TABLET | Freq: Every day | ORAL | Status: DC

## 2013-03-23 NOTE — Addendum Note (Signed)
Addended byWilson Singer, Kesha Hurrell H on: 03/23/2013 02:08 PM   Modules accepted: Orders

## 2013-03-23 NOTE — Progress Notes (Signed)
P- 93  Subjective:    Alison Stone is being seen today for her first obstetrical visit.  This is a planned pregnancy. She is at [redacted]w[redacted]d gestation. Her obstetrical history is significant for none. Relationship with FOB: spouse, living together. Patient does not intend to breast feed. Pregnancy history fully reviewed.  Menstrual History: OB History   Grav Para Term Preterm Abortions TAB SAB Ect Mult Living   5 3 3  0 1 0 1 0 0 3      Last Pap: 2013 Normal Results Menarche age: 45 Regular  Patient's last menstrual period was 01/12/2013.    The following portions of the patient's history were reviewed and updated as appropriate: allergies, current medications, past family history, past medical history, past social history, past surgical history and problem list.  Review of Systems A comprehensive review of systems was negative.    Objective:    BP 123/87  Temp(Src) 98.5 F (36.9 C)  Wt 108 lb (48.988 kg)  BMI 21.09 kg/m2  LMP 01/12/2013  General Appearance:    Alert, cooperative, no distress, appears stated age  Head:    Normocephalic, without obvious abnormality, atraumatic  Eyes:    PERRL, conjunctiva/corneas clear, EOM's intact, fundi    benign, both eyes  Ears:    Normal TM's and external ear canals, both ears  Nose:   Nares normal, septum midline, mucosa normal, no drainage    or sinus tenderness  Throat:   Lips, mucosa, and tongue normal; teeth and gums normal  Neck:   Supple, symmetrical, trachea midline, no adenopathy;    thyroid:  no enlargement/tenderness/nodules; no carotid   bruit or JVD  Back:     Symmetric, no curvature, ROM normal, no CVA tenderness  Lungs:     Clear to auscultation bilaterally, respirations unlabored  Chest Wall:    No tenderness or deformity   Heart:    Regular rate and rhythm, S1 and S2 normal, no murmur, rub   or gallop  Breast Exam:    No tenderness, masses, or nipple abnormality  Abdomen:     Soft, non-tender, bowel sounds active all  four quadrants,    no masses, no organomegaly  Genitalia:    Normal female without lesion, discharge or tenderness     Extremities:   Extremities normal, atraumatic, no cyanosis or edema  Pulses:   2+ and symmetric all extremities  Skin:   Skin color, texture, turgor normal, no rashes or lesions  Lymph nodes:   Cervical, supraclavicular, and axillary nodes normal  Neurologic:   CNII-XII intact, normal strength, sensation and reflexes    Throughout  Unable to Auscultate FHR with doppler today      Assessment:    Pregnancy at [redacted]w[redacted]d weeks  Unknown LMP, [place] pending   Plan:    Initial labs drawn. Prenatal vitamins.  Counseling provided regarding continued use of seat belts, cessation of alcohol consumption, smoking or use of illicit drugs; infection precautions i.e., influenza/TDAP immunizations, toxoplasmosis,CMV, parvovirus, listeria and varicella; workplace safety, exercise during pregnancy; routine dental care, safe medications, sexual activity, hot tubs, saunas, pools, travel, caffeine use, fish and methlymercury, potential toxins, hair treatments, varicose veins Weight gain recommendations reviewed: underweight/BMI< 18.5--> gain 28 - 40 lbs; normal weight/BMI 18.5 - 24.9--> gain 25 - 35 lbs; overweight/BMI 25 - 29.9--> gain 15 - 25 lbs; obese/BMI >30->gain  11 - 20 lbs Problem list reviewed and updated. AFP3 discussed: plan after 14 weeks. Role of ultrasound in pregnancy discussed; fetal survey:  Requested for dating. Amniocentesis discussed: not indicated. Follow up in 4 weeks. 80% of 40 min visit spent on counseling and coordination of care.   Amy Wilson Singer CNM

## 2013-03-24 LAB — VITAMIN D 25 HYDROXY (VIT D DEFICIENCY, FRACTURES): Vit D, 25-Hydroxy: 37 ng/mL (ref 30–89)

## 2013-03-24 LAB — OBSTETRIC PANEL
Antibody Screen: NEGATIVE
Basophils Absolute: 0 10*3/uL (ref 0.0–0.1)
Eosinophils Relative: 0 % (ref 0–5)
HCT: 37.4 % (ref 36.0–46.0)
Lymphocytes Relative: 34 % (ref 12–46)
Lymphs Abs: 1.7 10*3/uL (ref 0.7–4.0)
MCV: 76.5 fL — ABNORMAL LOW (ref 78.0–100.0)
Monocytes Absolute: 0.7 10*3/uL (ref 0.1–1.0)
Neutro Abs: 2.5 10*3/uL (ref 1.7–7.7)
RBC: 4.89 MIL/uL (ref 3.87–5.11)
Rubella: 4.19 Index — ABNORMAL HIGH (ref <0.90)
WBC: 4.9 10*3/uL (ref 4.0–10.5)

## 2013-03-24 LAB — GC/CHLAMYDIA PROBE AMP
CT Probe RNA: NEGATIVE
GC Probe RNA: NEGATIVE

## 2013-03-24 LAB — HIV ANTIBODY (ROUTINE TESTING W REFLEX): HIV: NONREACTIVE

## 2013-03-24 LAB — VARICELLA ZOSTER ANTIBODY, IGG: Varicella IgG: 831 Index — ABNORMAL HIGH (ref <135.00)

## 2013-03-25 LAB — CULTURE, OB URINE: Colony Count: NO GROWTH

## 2013-03-27 ENCOUNTER — Encounter: Payer: Self-pay | Admitting: Advanced Practice Midwife

## 2013-03-27 DIAGNOSIS — IMO0002 Reserved for concepts with insufficient information to code with codable children: Secondary | ICD-10-CM | POA: Insufficient documentation

## 2013-03-27 LAB — PAP IG W/ RFLX HPV ASCU

## 2013-03-27 LAB — HEMOGLOBINOPATHY EVALUATION
Hgb F Quant: 0 % (ref 0.0–2.0)
Hgb S Quant: 0 %

## 2013-04-03 ENCOUNTER — Other Ambulatory Visit: Payer: Medicaid Other

## 2013-04-20 ENCOUNTER — Ambulatory Visit (INDEPENDENT_AMBULATORY_CARE_PROVIDER_SITE_OTHER): Payer: Medicaid Other | Admitting: Advanced Practice Midwife

## 2013-04-20 VITALS — BP 116/81 | Temp 97.6°F | Wt 108.0 lb

## 2013-04-20 DIAGNOSIS — Z3480 Encounter for supervision of other normal pregnancy, unspecified trimester: Secondary | ICD-10-CM

## 2013-04-20 DIAGNOSIS — Z3482 Encounter for supervision of other normal pregnancy, second trimester: Secondary | ICD-10-CM

## 2013-04-20 LAB — POCT URINALYSIS DIPSTICK
Ketones, UA: NEGATIVE
Leukocytes, UA: NEGATIVE
Nitrite, UA: NEGATIVE
Urobilinogen, UA: 1
pH, UA: 6

## 2013-04-20 NOTE — Progress Notes (Signed)
Routine Obstetrical Visit  Subjective:    Alison Stone is being seen today for her routine obstetrical visit. She is at [redacted]w[redacted]d gestation. Dated by uncertain LMP.  Patient reports mild nausea w/ out vomiting. No other concerns.    Objective:     BP 116/81  Temp(Src) 97.6 F (36.4 C)  Wt 108 lb (48.988 kg)  BMI 21.09 kg/m2  LMP 01/12/2013 Physical Exam  Exam FHR 190 FH 1/2 U and SP   Assessment:    Pregnancy: [id number] Patient Active Problem List   Diagnosis Date Noted  . LSIL (low grade squamous intraepithelial lesion) on Pap smear 03/27/2013  . Supervision of other normal pregnancy 03/23/2013       Plan:     Prenatal vitamins. Problem list reviewed and updated. Early [place] requested meant for patient to have one before visit, she was not scheduled to date, scheduled at todays visit. Reviewed NOB labs. Review plan for pap PP NV. Follow up in 4 weeks. Quad NV Schedule 20 wk [place]  80% of 20 min visit spent on counseling and coordination of care.     Chais Fehringer 04/20/2013

## 2013-04-20 NOTE — Progress Notes (Signed)
Pulse: 99 

## 2013-04-24 ENCOUNTER — Ambulatory Visit (HOSPITAL_COMMUNITY)
Admission: RE | Admit: 2013-04-24 | Discharge: 2013-04-24 | Disposition: A | Discharge status: Home / Self Care | Payer: Medicaid Other | Source: Ambulatory Visit | Attending: Advanced Practice Midwife | Admitting: Advanced Practice Midwife

## 2013-04-24 DIAGNOSIS — Z3481 Encounter for supervision of other normal pregnancy, first trimester: Secondary | ICD-10-CM

## 2013-04-24 DIAGNOSIS — Z3689 Encounter for other specified antenatal screening: Secondary | ICD-10-CM | POA: Insufficient documentation

## 2013-05-18 ENCOUNTER — Ambulatory Visit (INDEPENDENT_AMBULATORY_CARE_PROVIDER_SITE_OTHER): Payer: Medicaid Other | Admitting: Advanced Practice Midwife

## 2013-05-18 ENCOUNTER — Encounter: Payer: Self-pay | Admitting: Advanced Practice Midwife

## 2013-05-18 VITALS — BP 117/81 | Temp 96.8°F | Wt 113.0 lb

## 2013-05-18 DIAGNOSIS — Z3480 Encounter for supervision of other normal pregnancy, unspecified trimester: Secondary | ICD-10-CM

## 2013-05-18 DIAGNOSIS — Z3482 Encounter for supervision of other normal pregnancy, second trimester: Secondary | ICD-10-CM

## 2013-05-18 LAB — POCT URINALYSIS DIPSTICK
Bilirubin, UA: NEGATIVE
Blood, UA: NEGATIVE
Nitrite, UA: NEGATIVE
Protein, UA: NEGATIVE
pH, UA: 6

## 2013-05-18 NOTE — Progress Notes (Signed)
Subjective: Alison Stone is a 27 y.o. at 18 weeks by LMP, early ultrasound  Patient denies vaginal leaking of fluid or bleeding, denies contractions.  Reports no fetal movment.  Denies concerns today.  Objective: Filed Vitals:   05/18/13 1018  BP: 117/81  Temp: 96.8 F (36 C)   160 FHR 1 FB below [place] Fundal Height Fetal Position NA  Assessment: Patient Active Problem List   Diagnosis Date Noted  . Supervision of other normal pregnancy 03/23/2013    Plan: Patient to return to clinic in 4 weeks ROS [place] scheduled Quad today Reviewed Pap results again, I had made a previous error, her pap results were normal.  Reviewed warning signs in pregnancy. Patient to call with concerns PRN. Reviewed triage location.  20 min spent with patient greater than 80% spent in counseling and coordination of care.   Faheem Ziemann Wilson Singer CNM

## 2013-05-18 NOTE — Progress Notes (Signed)
Pulse 92  Pt doing well.

## 2013-05-18 NOTE — Addendum Note (Signed)
Addended by: George Hugh on: 05/18/2013 11:45 AM   Modules accepted: Orders

## 2013-05-21 LAB — AFP, QUAD SCREEN
Age Alone: 1:944 {titer}
Curr Gest Age: 17.2 wks.days
INH: 202 pg/mL
MoM for AFP: 0.98
MoM for hCG: 1.07
Tri 18 Scr Risk Est: NEGATIVE
Trisomy 18 (Edward) Syndrome Interp.: 1:65300 {titer}

## 2013-06-01 ENCOUNTER — Ambulatory Visit (HOSPITAL_COMMUNITY)
Admission: RE | Admit: 2013-06-01 | Discharge: 2013-06-01 | Disposition: A | Discharge status: Home / Self Care | Payer: Medicaid Other | Source: Ambulatory Visit | Attending: Advanced Practice Midwife | Admitting: Advanced Practice Midwife

## 2013-06-01 ENCOUNTER — Other Ambulatory Visit: Payer: Self-pay | Admitting: Advanced Practice Midwife

## 2013-06-01 DIAGNOSIS — Z3689 Encounter for other specified antenatal screening: Secondary | ICD-10-CM | POA: Insufficient documentation

## 2013-06-01 DIAGNOSIS — Z3482 Encounter for supervision of other normal pregnancy, second trimester: Secondary | ICD-10-CM

## 2013-06-12 ENCOUNTER — Encounter: Payer: Self-pay | Admitting: Advanced Practice Midwife

## 2013-06-12 DIAGNOSIS — Z3480 Encounter for supervision of other normal pregnancy, unspecified trimester: Secondary | ICD-10-CM | POA: Insufficient documentation

## 2013-06-15 ENCOUNTER — Ambulatory Visit (INDEPENDENT_AMBULATORY_CARE_PROVIDER_SITE_OTHER): Payer: Medicaid Other | Admitting: Advanced Practice Midwife

## 2013-06-15 ENCOUNTER — Encounter: Payer: Self-pay | Admitting: Advanced Practice Midwife

## 2013-06-15 VITALS — BP 108/73 | Temp 98.9°F | Wt 120.0 lb

## 2013-06-15 DIAGNOSIS — Z3482 Encounter for supervision of other normal pregnancy, second trimester: Secondary | ICD-10-CM

## 2013-06-15 DIAGNOSIS — Z3480 Encounter for supervision of other normal pregnancy, unspecified trimester: Secondary | ICD-10-CM

## 2013-06-15 NOTE — Progress Notes (Signed)
        Physical Exam Exam

## 2013-06-15 NOTE — Progress Notes (Signed)
Subjective: Alison Stone is a 27 y.o. at 22 weeks by LMP  Patient denies vaginal leaking of fluid or bleeding, denies contractions.  Reports positive fetal movment.  Denies concerns today. Patient anxious about ECV on [place], desires repeat US.  Objective: Filed Vitals:   06/15/13 0954  BP: 108/73  Temp: 98.9 F (37.2 C)   150 FHR 23 Fundal Height Fetal Position unknown  Assessment: Patient Active Problem List   Diagnosis Date Noted  . Echogenic focus  06/12/2013  . Supervision of other normal pregnancy 03/23/2013    Plan: Patient to return to clinic in 4 weeks Plan repeat US.  Reviewed warning signs in pregnancy. Patient to call with concerns PRN. Reviewed triage location.  20 min spent with patient greater than 80% spent in counseling and coordination of care.   Lillyen Schow Wilson Singer CNM

## 2013-06-28 NOTE — L&D Delivery Note (Signed)
Delivery Note At 3:14 PM a viable female was delivered via Vaginal, Spontaneous Delivery (Presentation: ; Occiput Posterior).  APGAR: 8, 9; weight .   Placenta status: delivered with assistance; small placental fragment retrieved from the lower uterine segment .  Cord: 3 vessels with the following complications: None.    Anesthesia: None  Episiotomy: None Lacerations: None Suture Repair: n/a Est. Blood Loss (mL): 300 ml  Mom to postpartum.  Baby to Couplet care / Skin to Skin.  Antionette CharLisa Jackson-Moore 10/24/2013, 3:46 PM

## 2013-06-29 ENCOUNTER — Encounter: Payer: Self-pay | Admitting: Advanced Practice Midwife

## 2013-06-29 ENCOUNTER — Ambulatory Visit (HOSPITAL_COMMUNITY)
Admission: RE | Admit: 2013-06-29 | Discharge: 2013-06-29 | Disposition: A | Discharge status: Home / Self Care | Payer: Medicaid Other | Source: Ambulatory Visit | Attending: Advanced Practice Midwife | Admitting: Advanced Practice Midwife

## 2013-06-29 DIAGNOSIS — Z3689 Encounter for other specified antenatal screening: Secondary | ICD-10-CM | POA: Insufficient documentation

## 2013-06-29 DIAGNOSIS — Z3480 Encounter for supervision of other normal pregnancy, unspecified trimester: Secondary | ICD-10-CM

## 2013-06-29 LAB — US OB DETAIL + 14 WK

## 2013-07-13 ENCOUNTER — Ambulatory Visit (INDEPENDENT_AMBULATORY_CARE_PROVIDER_SITE_OTHER): Payer: Medicaid Other | Admitting: Obstetrics

## 2013-07-13 ENCOUNTER — Encounter: Payer: Self-pay | Admitting: Obstetrics

## 2013-07-13 ENCOUNTER — Other Ambulatory Visit: Payer: Medicaid Other

## 2013-07-13 VITALS — BP 107/71 | Temp 97.1°F | Wt 125.0 lb

## 2013-07-13 DIAGNOSIS — Z3480 Encounter for supervision of other normal pregnancy, unspecified trimester: Secondary | ICD-10-CM

## 2013-07-13 LAB — POCT URINALYSIS DIPSTICK
Bilirubin, UA: NEGATIVE
Blood, UA: NEGATIVE
Glucose, UA: NEGATIVE
KETONES UA: NEGATIVE
LEUKOCYTES UA: NEGATIVE
NITRITE UA: NEGATIVE
PH UA: 8
PROTEIN UA: NEGATIVE
Spec Grav, UA: <=1.005
UROBILINOGEN UA: NEGATIVE

## 2013-07-13 LAB — CBC
HCT: 29.7 % — ABNORMAL LOW (ref 36.0–46.0)
Hemoglobin: 9.7 g/dL — ABNORMAL LOW (ref 12.0–15.0)
MCH: 24 pg — AB (ref 26.0–34.0)
MCHC: 32.7 g/dL (ref 30.0–36.0)
MCV: 73.3 fL — ABNORMAL LOW (ref 78.0–100.0)
PLATELETS: 266 10*3/uL (ref 150–400)
RBC: 4.05 MIL/uL (ref 3.87–5.11)
RDW: 16.2 % — ABNORMAL HIGH (ref 11.5–15.5)
WBC: 12 10*3/uL — AB (ref 4.0–10.5)

## 2013-07-13 NOTE — Progress Notes (Signed)
Pulse: 106

## 2013-07-14 LAB — RPR

## 2013-07-14 LAB — HIV ANTIBODY (ROUTINE TESTING W REFLEX): HIV: NONREACTIVE

## 2013-07-17 LAB — GLUCOSE TOLERANCE, 1 HOUR (50G) W/O FASTING: GLUCOSE 1 HOUR GTT: 84 mg/dL (ref 70–140)

## 2013-07-27 ENCOUNTER — Encounter: Payer: Self-pay | Admitting: Advanced Practice Midwife

## 2013-07-27 ENCOUNTER — Ambulatory Visit (INDEPENDENT_AMBULATORY_CARE_PROVIDER_SITE_OTHER): Payer: Medicaid Other | Admitting: Advanced Practice Midwife

## 2013-07-27 VITALS — BP 106/70 | Temp 98.5°F | Wt 127.0 lb

## 2013-07-27 DIAGNOSIS — L708 Other acne: Secondary | ICD-10-CM

## 2013-07-27 DIAGNOSIS — Z3480 Encounter for supervision of other normal pregnancy, unspecified trimester: Secondary | ICD-10-CM

## 2013-07-27 DIAGNOSIS — L709 Acne, unspecified: Secondary | ICD-10-CM

## 2013-07-27 LAB — POCT URINALYSIS DIPSTICK
Bilirubin, UA: NEGATIVE
Blood, UA: NEGATIVE
Glucose, UA: NEGATIVE
KETONES UA: NEGATIVE
Leukocytes, UA: NEGATIVE
Nitrite, UA: NEGATIVE
PH UA: 6
PROTEIN UA: NEGATIVE
Spec Grav, UA: 1.015
Urobilinogen, UA: NEGATIVE

## 2013-07-27 MED ORDER — CLINDAMYCIN PHOSPHATE 1 % EX GEL: Freq: Two times a day (BID) | CUTANEOUS | Status: DC

## 2013-07-27 NOTE — Addendum Note (Signed)
Addended byWilson Singer: Preslea Rhodus H on: 07/27/2013 02:53 PM   Modules accepted: Orders

## 2013-07-27 NOTE — Progress Notes (Addendum)
Subjective: Virgel Gessameisha C Luz is a 28 y.o. at 28 weeks by LMP  Patient denies vaginal leaking of fluid or bleeding, denies contractions.  Reports positive fetal movment.   Desires treatment for acne. Reports leg cramps.  Objective: Filed Vitals:   07/27/13 1018  BP: 106/70  Temp: 98.5 F (36.9 C)   140 FHR 28 Fundal Height Fetal Position unknown  Assessment: Patient Active Problem List   Diagnosis Date Noted  . Echogenic focus  06/12/2013  . Supervision of other normal pregnancy 03/23/2013    Plan: Patient to return to clinic in 2 weeks Patient made aware of ECV on US, reviewed that this is a benign finding.  Discuss BCM NV, gave IUD handout. Encouraged BRF class. Info given. Reviewed warning signs in pregnancy. Patient to call with concerns PRN. Reviewed triage location. Clindamycin Gel apply BID to face for acne.  20 min spent with patient greater than 80% spent in counseling and coordination of care.    Meshawn Oconnor Wilson SingerWren CNM

## 2013-07-27 NOTE — Progress Notes (Signed)
Pulse 97 Pt is having leg cramps at night and has to get up and walk around to ease pain.  Pt is still having morning sickness/nausea.  Pt is also wondering what to use for facial acne.  Pt states that her breakouts have increased. Pt states that she had lab work at last visit and would like to discuss results.

## 2013-08-10 ENCOUNTER — Encounter: Payer: Medicaid Other | Admitting: Advanced Practice Midwife

## 2013-08-17 ENCOUNTER — Encounter: Payer: Self-pay | Admitting: Advanced Practice Midwife

## 2013-08-17 ENCOUNTER — Ambulatory Visit (INDEPENDENT_AMBULATORY_CARE_PROVIDER_SITE_OTHER): Payer: Medicaid Other | Admitting: Advanced Practice Midwife

## 2013-08-17 VITALS — BP 115/77 | Temp 98.2°F | Wt 130.0 lb

## 2013-08-17 DIAGNOSIS — Z3480 Encounter for supervision of other normal pregnancy, unspecified trimester: Secondary | ICD-10-CM

## 2013-08-17 LAB — POCT URINALYSIS DIPSTICK
BILIRUBIN UA: NEGATIVE
Glucose, UA: NEGATIVE
Ketones, UA: NEGATIVE
Leukocytes, UA: NEGATIVE
Nitrite, UA: NEGATIVE
Protein, UA: NEGATIVE
RBC UA: NEGATIVE
SPEC GRAV UA: 1.01
Urobilinogen, UA: NEGATIVE
pH, UA: 7

## 2013-08-17 NOTE — Progress Notes (Signed)
Pulse 99  Pt states that she would like to have another u/s.  Pt states that she is still having leg pain during her sleep.

## 2013-08-17 NOTE — Progress Notes (Signed)
Subjective: Alison Stone is a 28 y.o. at 31 weeks by LMP  Patient denies vaginal leaking of fluid or bleeding, denies contractions.  Reports positive fetal movment.  Denies concerns today.  Objective: Filed Vitals:   08/17/13 1131  BP: 115/77  Temp: 98.2 F (36.8 C)   150 FHR 29 Fundal Height Fetal Position cephalic  Assessment: Patient Active Problem List   Diagnosis Date Noted  . Echogenic focus  06/12/2013  . Supervision of other normal pregnancy 03/23/2013    Plan: Patient to return to clinic in 2 weeks Growth US requested, asked to eval echogenic cardiac focus as patient remains concerned despite counseling. Discuss NV. Reviewed warning signs in pregnancy. Patient to call with concerns PRN. Reviewed triage location.  20 min spent with patient greater than 80% spent in counseling and coordination of care.   Aleksey Newbern Wilson SingerWren CNM

## 2013-08-24 ENCOUNTER — Ambulatory Visit (HOSPITAL_COMMUNITY)
Admission: RE | Admit: 2013-08-24 | Discharge: 2013-08-24 | Disposition: A | Discharge status: Home / Self Care | Payer: Medicaid Other | Source: Ambulatory Visit | Attending: Advanced Practice Midwife | Admitting: Advanced Practice Midwife

## 2013-08-24 DIAGNOSIS — Z3480 Encounter for supervision of other normal pregnancy, unspecified trimester: Secondary | ICD-10-CM

## 2013-08-24 DIAGNOSIS — O358XX0 Maternal care for other (suspected) fetal abnormality and damage, not applicable or unspecified: Secondary | ICD-10-CM | POA: Insufficient documentation

## 2013-08-24 DIAGNOSIS — Z3689 Encounter for other specified antenatal screening: Secondary | ICD-10-CM | POA: Insufficient documentation

## 2013-08-25 ENCOUNTER — Inpatient Hospital Stay (HOSPITAL_COMMUNITY): Admission: AD | Admit: 2013-08-25 | Payer: Medicaid Other | Source: Ambulatory Visit | Admitting: Obstetrics

## 2013-08-31 ENCOUNTER — Ambulatory Visit (INDEPENDENT_AMBULATORY_CARE_PROVIDER_SITE_OTHER): Payer: Medicaid Other | Admitting: Advanced Practice Midwife

## 2013-08-31 VITALS — BP 117/79 | Temp 98.1°F | Wt 131.0 lb

## 2013-08-31 DIAGNOSIS — Z3480 Encounter for supervision of other normal pregnancy, unspecified trimester: Secondary | ICD-10-CM

## 2013-08-31 LAB — POCT URINALYSIS DIPSTICK
BILIRUBIN UA: NEGATIVE
Blood, UA: NEGATIVE
GLUCOSE UA: NEGATIVE
Ketones, UA: NEGATIVE
Leukocytes, UA: NEGATIVE
Nitrite, UA: NEGATIVE
Protein, UA: NEGATIVE
SPEC GRAV UA: 1.01
Urobilinogen, UA: NEGATIVE
pH, UA: 6

## 2013-08-31 NOTE — Progress Notes (Signed)
Doing well. Considering breastfeeding. Declines Tdap. Has pediatrician.  Pills for BCM.  Alison Stone CNM

## 2013-08-31 NOTE — Progress Notes (Signed)
Pulse: 108

## 2013-09-11 ENCOUNTER — Encounter: Payer: Self-pay | Admitting: Obstetrics

## 2013-09-11 ENCOUNTER — Ambulatory Visit (INDEPENDENT_AMBULATORY_CARE_PROVIDER_SITE_OTHER): Payer: Medicaid Other | Admitting: Obstetrics

## 2013-09-11 VITALS — BP 102/72 | Temp 98.3°F | Wt 129.0 lb

## 2013-09-11 DIAGNOSIS — Z3480 Encounter for supervision of other normal pregnancy, unspecified trimester: Secondary | ICD-10-CM

## 2013-09-11 LAB — POCT URINALYSIS DIPSTICK
Bilirubin, UA: NEGATIVE
Blood, UA: NEGATIVE
Glucose, UA: NEGATIVE
KETONES UA: NEGATIVE
LEUKOCYTES UA: NEGATIVE
Nitrite, UA: NEGATIVE
Protein, UA: NEGATIVE
Spec Grav, UA: <=1.005
UROBILINOGEN UA: NEGATIVE
pH, UA: 7

## 2013-09-11 NOTE — Progress Notes (Signed)
Pulse: 104

## 2013-09-12 ENCOUNTER — Encounter: Payer: Medicaid Other | Admitting: Obstetrics

## 2013-09-14 ENCOUNTER — Encounter: Payer: Medicaid Other | Admitting: Advanced Practice Midwife

## 2013-09-21 ENCOUNTER — Ambulatory Visit (INDEPENDENT_AMBULATORY_CARE_PROVIDER_SITE_OTHER): Payer: Medicaid Other | Admitting: Advanced Practice Midwife

## 2013-09-21 VITALS — BP 123/87 | Wt 135.0 lb

## 2013-09-21 DIAGNOSIS — Z3480 Encounter for supervision of other normal pregnancy, unspecified trimester: Secondary | ICD-10-CM

## 2013-09-21 LAB — POCT URINALYSIS DIPSTICK
BILIRUBIN UA: NEGATIVE
Blood, UA: NEGATIVE
Glucose, UA: NEGATIVE
Ketones, UA: NEGATIVE
LEUKOCYTES UA: NEGATIVE
NITRITE UA: NEGATIVE
PH UA: 5
PROTEIN UA: NEGATIVE
Spec Grav, UA: 1.02
Urobilinogen, UA: NEGATIVE

## 2013-09-21 NOTE — Progress Notes (Signed)
Subjective: Alison Stone is a 28 y.o. at 36 weeks by LMP  Patient denies vaginal leaking of fluid or bleeding, denies contractions.  Reports positive fetal movment.  Denies concerns today. Patient reports constipation, having BM weekly.  Objective: Filed Vitals:   09/21/13 0939  BP: 123/87   150 FHR 35 Fundal Height Fetal Position cephalic  Assessment: Patient Active Problem List   Diagnosis Date Noted  . Echogenic focus  06/12/2013  . Supervision of other normal pregnancy 03/23/2013  Consitpation  Plan: Patient to return to clinic in 1 weeks GBS today, review NV BRF (most likely); OCP, has a pediatrician. Reviewed warning signs in pregnancy. Patient to call with concerns PRN. Reviewed triage location. Discussed dietary changes to prevent constipation, if not helpful patient to take stool softeners BID.  20 min spent with patient greater than 80% spent in counseling and coordination of care.   Daymein Nunnery Wilson SingerWren CNM

## 2013-09-21 NOTE — Progress Notes (Signed)
Pulse 100 Pt states that she has been more constipated.  Pt states that she is having some pelvic pain.

## 2013-09-23 LAB — STREP B DNA PROBE: GBSP: NEGATIVE

## 2013-10-05 ENCOUNTER — Encounter: Payer: Self-pay | Admitting: Advanced Practice Midwife

## 2013-10-05 ENCOUNTER — Ambulatory Visit (INDEPENDENT_AMBULATORY_CARE_PROVIDER_SITE_OTHER): Payer: Medicaid Other | Admitting: Advanced Practice Midwife

## 2013-10-05 VITALS — BP 116/81 | Temp 97.8°F | Wt 139.0 lb

## 2013-10-05 DIAGNOSIS — Z3480 Encounter for supervision of other normal pregnancy, unspecified trimester: Secondary | ICD-10-CM

## 2013-10-05 LAB — POCT URINALYSIS DIPSTICK
Bilirubin, UA: NEGATIVE
Glucose, UA: NEGATIVE
Ketones, UA: NEGATIVE
Leukocytes, UA: NEGATIVE
Nitrite, UA: NEGATIVE
PH UA: 6
PROTEIN UA: NEGATIVE
RBC UA: NEGATIVE
SPEC GRAV UA: 1.015
UROBILINOGEN UA: NEGATIVE

## 2013-10-05 NOTE — Progress Notes (Signed)
Pulse: 99 Patient states she is having pelvic pressure. Patient states she would like to know how much he weighs now.

## 2013-10-05 NOTE — Progress Notes (Signed)
Subjective: Alison Stone is a 28 y.o. at 38 weeks by LMP, early ultrasound  Patient denies vaginal leaking of fluid or bleeding, denies contractions.  Reports positive fetal movment.  Denies concerns today. Ready for delivery.  Objective: Filed Vitals:   10/05/13 1025  BP: 116/81  Temp: 97.8 F (36.6 C)   140 FHR 38 Fundal Height Fetal Position cephalic  Assessment: Patient Active Problem List   Diagnosis Date Noted  . Echogenic focus  06/12/2013  . Supervision of other normal pregnancy 03/23/2013    Plan: Patient to return to clinic in 1 weeks Reviewed warning signs in pregnancy. Patient to call with concerns PRN. Reviewed triage location.  15 min spent with patient greater than 80% spent in counseling and coordination of care.   Senie Lanese Wilson SingerWren CNM

## 2013-10-12 ENCOUNTER — Ambulatory Visit (INDEPENDENT_AMBULATORY_CARE_PROVIDER_SITE_OTHER): Payer: Medicaid Other | Admitting: Advanced Practice Midwife

## 2013-10-12 ENCOUNTER — Encounter: Payer: Self-pay | Admitting: Advanced Practice Midwife

## 2013-10-12 VITALS — BP 120/81 | Temp 98.5°F | Wt 140.0 lb

## 2013-10-12 DIAGNOSIS — Z3480 Encounter for supervision of other normal pregnancy, unspecified trimester: Secondary | ICD-10-CM

## 2013-10-12 NOTE — Progress Notes (Signed)
Pulse 98 Pt is doing well.

## 2013-10-12 NOTE — Progress Notes (Signed)
Subjective: Virgel Gessameisha C Robers is a 28 y.o. at 39 weeks by LMP, early ultrasound  Patient denies vaginal leaking of fluid or bleeding, denies contractions.  Reports positive fetal movment.  Denies concerns today.  Objective: Filed Vitals:   10/12/13 1018  BP: 120/81  Temp: 98.5 F (36.9 C)   150 FHR 40 Fundal Height Fetal Position cephalic EFW ~ 3400 gm 3/70/-2  Assessment: Patient Active Problem List   Diagnosis Date Noted  . Echogenic focus  06/12/2013  . Supervision of other normal pregnancy 03/23/2013    Plan: Patient to return to clinic in 1 weeks Reviewed warning signs in pregnancy. Patient to call with concerns PRN. Reviewed triage location. Reviewed signs of labor.  Thetis Schwimmer Wilson SingerWren CNM

## 2013-10-19 ENCOUNTER — Telehealth (HOSPITAL_COMMUNITY): Payer: Self-pay | Admitting: *Deleted

## 2013-10-19 ENCOUNTER — Ambulatory Visit (INDEPENDENT_AMBULATORY_CARE_PROVIDER_SITE_OTHER): Payer: Medicaid Other | Admitting: Advanced Practice Midwife

## 2013-10-19 ENCOUNTER — Encounter: Payer: Self-pay | Admitting: Advanced Practice Midwife

## 2013-10-19 VITALS — BP 125/86 | HR 110 | Temp 98.4°F | Wt 143.0 lb

## 2013-10-19 DIAGNOSIS — Z3480 Encounter for supervision of other normal pregnancy, unspecified trimester: Secondary | ICD-10-CM

## 2013-10-19 LAB — POCT URINALYSIS DIPSTICK
Bilirubin, UA: NEGATIVE
Blood, UA: NEGATIVE
Glucose, UA: NEGATIVE
Nitrite, UA: NEGATIVE
PH UA: 6.5
SPEC GRAV UA: 1.01
Urobilinogen, UA: NEGATIVE

## 2013-10-19 NOTE — Telephone Encounter (Signed)
Preadmission screen  

## 2013-10-19 NOTE — Progress Notes (Signed)
Subjective: Virgel Gessameisha C Jacome is a 28 y.o. at 40 weeks by LMP, early ultrasound  Patient denies vaginal leaking of fluid or bleeding, denies contractions.  Reports positive fetal movment.  Denies concerns today. Ready for delivery. Desires IOL, aware of pros and cons. Aware she may wait until after 41 weeks. Patient request IOL for next Tuesday.   Objective: Filed Vitals:   10/19/13 1037  BP: 125/86  Pulse: 110  Temp: 98.4 F (36.9 C)   140 FHR 40 Fundal Height Fetal Position cephalic, confirmed by US Favorable cervix. 3/70/-2 on last exam EFW 3300 gm  Assessment: Patient Active Problem List   Diagnosis Date Noted  . Echogenic focus  06/12/2013  . Supervision of other normal pregnancy 03/23/2013    Plan: Scheduled IOL @ 40 4/7 wks by L, 13 wk US Education given and reviewed, signed consent. Scheduled @ 0630. Reviewed warning signs in pregnancy. Patient to call with concerns PRN. Reviewed triage location.  Brianca Fortenberry Wilson SingerWren CNM

## 2013-10-23 ENCOUNTER — Other Ambulatory Visit: Payer: Self-pay | Admitting: Obstetrics

## 2013-10-23 ENCOUNTER — Encounter (HOSPITAL_COMMUNITY): Payer: Self-pay

## 2013-10-23 ENCOUNTER — Inpatient Hospital Stay (HOSPITAL_COMMUNITY)
Admission: RE | Admit: 2013-10-23 | Discharge: 2013-10-26 | DRG: 775 | Disposition: A | Discharge status: Home / Self Care | Payer: Medicaid Other | Source: Ambulatory Visit | Attending: Obstetrics & Gynecology | Admitting: Obstetrics & Gynecology

## 2013-10-23 DIAGNOSIS — D649 Anemia, unspecified: Secondary | ICD-10-CM | POA: Diagnosis present

## 2013-10-23 DIAGNOSIS — Z823 Family history of stroke: Secondary | ICD-10-CM

## 2013-10-23 DIAGNOSIS — Z3490 Encounter for supervision of normal pregnancy, unspecified, unspecified trimester: Secondary | ICD-10-CM

## 2013-10-23 DIAGNOSIS — O9902 Anemia complicating childbirth: Principal | ICD-10-CM | POA: Diagnosis present

## 2013-10-23 DIAGNOSIS — Z3480 Encounter for supervision of other normal pregnancy, unspecified trimester: Secondary | ICD-10-CM

## 2013-10-23 DIAGNOSIS — Z825 Family history of asthma and other chronic lower respiratory diseases: Secondary | ICD-10-CM

## 2013-10-23 LAB — CBC
HCT: 29.4 % — ABNORMAL LOW (ref 36.0–46.0)
HEMOGLOBIN: 8.6 g/dL — AB (ref 12.0–15.0)
MCH: 20.2 pg — ABNORMAL LOW (ref 26.0–34.0)
MCHC: 29.3 g/dL — AB (ref 30.0–36.0)
MCV: 69.2 fL — ABNORMAL LOW (ref 78.0–100.0)
Platelets: 197 10*3/uL (ref 150–400)
RBC: 4.25 MIL/uL (ref 3.87–5.11)
RDW: 18.3 % — ABNORMAL HIGH (ref 11.5–15.5)
WBC: 8.9 10*3/uL (ref 4.0–10.5)

## 2013-10-23 LAB — RPR

## 2013-10-23 MED ORDER — ACETAMINOPHEN 325 MG PO TABS: 650.0000 mg | ORAL_TABLET | ORAL | Status: DC | PRN

## 2013-10-23 MED ORDER — LACTATED RINGERS IV SOLN: 500.0000 mL | INTRAVENOUS | Status: DC | PRN

## 2013-10-23 MED ORDER — OXYTOCIN BOLUS FROM INFUSION: 500.0000 mL | INTRAVENOUS | Status: DC

## 2013-10-23 MED ORDER — CITRIC ACID-SODIUM CITRATE 334-500 MG/5ML PO SOLN: 30.0000 mL | ORAL | Status: DC | PRN

## 2013-10-23 MED ORDER — OXYTOCIN 40 UNITS IN LACTATED RINGERS INFUSION - SIMPLE MED
1.0000 m[IU]/min | INTRAVENOUS | Status: DC
Start: 2013-10-23 — End: 2013-10-23

## 2013-10-23 MED ORDER — OXYCODONE-ACETAMINOPHEN 5-325 MG PO TABS
1.0000 | ORAL_TABLET | ORAL | Status: DC | PRN
  Administered 2013-10-24: 1 via ORAL
  Filled 2013-10-23: qty 1

## 2013-10-23 MED ORDER — IBUPROFEN 600 MG PO TABS
600.0000 mg | ORAL_TABLET | Freq: Four times a day (QID) | ORAL | Status: DC | PRN
  Administered 2013-10-24: 600 mg via ORAL
  Filled 2013-10-23: qty 1

## 2013-10-23 MED ORDER — ONDANSETRON HCL 4 MG/2ML IJ SOLN: 4.0000 mg | Freq: Four times a day (QID) | INTRAMUSCULAR | Status: DC | PRN

## 2013-10-23 MED ORDER — FLEET ENEMA 7-19 GM/118ML RE ENEM: 1.0000 | ENEMA | RECTAL | Status: DC | PRN

## 2013-10-23 MED ORDER — ZOLPIDEM TARTRATE 5 MG PO TABS
5.0000 mg | ORAL_TABLET | Freq: Once | ORAL | Status: AC
  Administered 2013-10-23: 5 mg via ORAL
  Filled 2013-10-23: qty 1

## 2013-10-23 MED ORDER — FENTANYL CITRATE 0.05 MG/ML IJ SOLN
50.0000 ug | INTRAMUSCULAR | Status: DC | PRN
  Administered 2013-10-23 – 2013-10-24 (×4): 50 ug via INTRAVENOUS
  Filled 2013-10-23 (×4): qty 2

## 2013-10-23 MED ORDER — OXYTOCIN 40 UNITS IN LACTATED RINGERS INFUSION - SIMPLE MED
1.0000 m[IU]/min | INTRAVENOUS | Status: DC
Start: 2013-10-23 — End: 2013-10-23
  Administered 2013-10-23: 1 m[IU]/min via INTRAVENOUS
  Administered 2013-10-23: 4 m[IU]/min via INTRAVENOUS
  Filled 2013-10-23: qty 1000

## 2013-10-23 MED ORDER — LIDOCAINE HCL (PF) 1 % IJ SOLN
30.0000 mL | INTRAMUSCULAR | Status: DC | PRN
  Filled 2013-10-23: qty 30

## 2013-10-23 MED ORDER — TERBUTALINE SULFATE 1 MG/ML IJ SOLN: 0.2500 mg | Freq: Once | INTRAMUSCULAR | Status: AC | PRN

## 2013-10-23 MED ORDER — LACTATED RINGERS IV SOLN
INTRAVENOUS | Status: DC
  Administered 2013-10-23 – 2013-10-24 (×3): via INTRAVENOUS

## 2013-10-23 MED ORDER — OXYTOCIN 40 UNITS IN LACTATED RINGERS INFUSION - SIMPLE MED: 62.5000 mL/h | INTRAVENOUS | Status: DC

## 2013-10-23 NOTE — Progress Notes (Signed)
Alison Stone is a 28 y.o. [id number] at 10918w4d by LMP admitted for induction of labor due to Elective at term.  Subjective: Patient resting at this time.   Objective: BP 105/68  Pulse 111  Temp(Src) 98 F (36.7 C) (Oral)  Resp 18  Ht 5' (1.524 m)  Wt 143 lb (64.864 kg)  BMI 27.93 kg/m2  LMP 01/12/2013      FHT:  FHR: 140 bpm, variability: moderate,  accelerations:  Present,  decelerations:  Absent UC:   irregular, every 2-4 minutes SVE:   Dilation: 6 Effacement (%): 70 Station: -2 Exam by:: Wilson SingerWren CNM Patient examined by RN, will recheck in 2-3 hours  Labs: Lab Results  Component Value Date   WBC 8.9 10/23/2013   HGB 8.6* 10/23/2013   HCT 29.4* 10/23/2013   MCV 69.2* 10/23/2013   PLT 197 10/23/2013    Assessment / Plan: Induction of labor due to term,  progressing well on pitocin Not in active labor, will consider AROM w/ descent.  Labor: Progressing normally Preeclampsia:  NA Fetal Wellbeing:  Category I Pain Control:  Fentanyl I/D:  n/a Anticipated MOD:  NSVD  Chaitanya Amedee Dessa PhiHowell Jennavieve Arrick 10/23/2013, 9:01 PM

## 2013-10-23 NOTE — Progress Notes (Signed)
Alison Stone is Alison 28 y.o. [id number] at 9690w4d by LMP admitted for induction of labor due to Elective at term.  Subjective: Not in active labor, reports irregular contractions.  Objective: BP 108/90  Pulse 94  Temp(Src) 97.9 F (36.6 C) (Oral)  Resp 18  Ht 5' (1.524 m)  Wt 143 lb (64.864 kg)  BMI 27.93 kg/m2  LMP 01/12/2013      FHT:  FHR: 140 bpm, variability: moderate,  accelerations:  Present,  decelerations:  Absent UC:   irregular, every 2-10 minutes SVE:   Dilation: 4 Effacement (%): 70 Station: -3 Exam by:: Alison Stone CNM  Labs: Lab Results  Component Value Date   WBC 8.9 10/23/2013   HGB 8.6* 10/23/2013   HCT 29.4* 10/23/2013   MCV 69.2* 10/23/2013   PLT 197 10/23/2013    Assessment / Plan: Not yet in labor Continue w/ pitocin IOL  Labor: Progressing normally Preeclampsia:  NA Fetal Wellbeing:  Category I Pain Control:  Labor support without medications I/D:  n/Alison Anticipated MOD:  NSVD  Suhaib Guzzo Dessa PhiHowell Leotta Weingarten 10/23/2013, 5:19 PM

## 2013-10-23 NOTE — Progress Notes (Signed)
Alison Stone is a 28 y.o. [id number] at 4139w4d by LMP admitted for induction of labor due to Elective at term.  Subjective: Patient comfortable, able to rest.  Patient desires to continue w/ IOL in am.   Objective: BP 115/78  Pulse 98  Temp(Src) 98 F (36.7 C) (Oral)  Resp 20  Ht 5' (1.524 m)  Wt 143 lb (64.864 kg)  BMI 27.93 kg/m2  LMP 01/12/2013      FHT:  FHR: 140 bpm, variability: moderate,  accelerations:  Present,  decelerations:  Absent UC:   irregular, every 2-6 minutes, coupling, mild to palpation SVE:  5/60/-2, dynamic cervix  Labs: Lab Results  Component Value Date   WBC 8.9 10/23/2013   HGB 8.6* 10/23/2013   HCT 29.4* 10/23/2013   MCV 69.2* 10/23/2013   PLT 197 10/23/2013    Assessment / Plan: Induction of labor for term. Patient not in active labor. Pitocin turned off. Regular diet. Rest. Ambien. Reevaluate in am.  MD Tamela OddiJackson Moore aware.   Labor: Not in active labor Preeclampsia:  NA Fetal Wellbeing:  Category I Pain Control:  Fentanyl I/D:  n/a Anticipated MOD:  NSVD  Alison Stone 10/23/2013, 10:02 PM

## 2013-10-23 NOTE — H&P (Signed)
Alison Stone Stone is a 28 y.o. female [phone] presenting for induction of labor @ 40 4/7 weeks by L, 13 wk US. History OB History   Grav Para Term Preterm Abortions TAB SAB Ect Mult Living   5 3 3  0 1 0 1 0 0 3     Past Medical History  Diagnosis Date  . 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 Medical history non-contributory    Past Surgical History  Procedure Laterality Date  . Throat surgery      cyst blocking airway   Family History: family history includes Asthma in her maternal aunt; Heart failure in her maternal grandfather; Hypertension in her maternal grandfather; Kidney disease in her maternal aunt; Migraines in her maternal aunt and paternal aunt; Nephrolithiasis in her paternal aunt; Stroke in her maternal grandfather. Social History:  reports that she has never smoked. She has never used smokeless tobacco. She reports that she does not drink alcohol or use illicit drugs.   Prenatal Transfer Tool  Maternal Diabetes: No Genetic Screening: Normal Maternal Ultrasounds/Referrals: Abnormal:  Findings:   Other:Cardiac Echogenic Focus Fetal Ultrasounds or other Referrals:  None Maternal Substance Abuse:  No Significant Maternal Medications:  None Significant Maternal Lab Results:  None Other Comments:  None  Review of Systems  Constitutional: Negative.   HENT: Negative.   Eyes: Negative.   Respiratory: Negative.   Cardiovascular: Negative.   Gastrointestinal: Negative.   Genitourinary: Negative.   Musculoskeletal: Negative.   Skin: Negative.   Neurological: Negative.   Endo/Heme/Allergies: Negative.   Psychiatric/Behavioral: Negative.       Temperature 97.7 F (36.5 C), temperature source Oral, height 5' (1.524 m), weight 143 lb (64.864 kg), last menstrual period 01/12/2013. Maternal Exam:  Uterine Assessment: Contraction strength is mild.  Contraction duration is 15 minutes. Contraction frequency is irregular.   Abdomen:  Patient reports no abdominal tenderness. Fundal height is 39.   Estimated fetal weight is 3300.   Fetal presentation: vertex  Introitus: Normal vulva. Normal vagina.  Ferning test: not done.  Nitrazine test: not done. Amniotic fluid character: not assessed.  Pelvis: adequate for delivery.      Fetal Exam Fetal Monitor Review: Mode: ultrasound.   Baseline rate: 140.  Variability: moderate (6-25 bpm).   Pattern: accelerations present and no decelerations.    Fetal State Assessment: Category I - tracings are normal.     Physical Exam  Constitutional: She is oriented to person, place, and time. She appears well-developed and well-nourished.  HENT:  Head: Normocephalic.  Neck: Normal range of motion. Neck supple.  Cardiovascular: Normal rate, regular rhythm and normal heart sounds.   Respiratory: Effort normal and breath sounds normal.  GI: Soft. Bowel sounds are normal.  Genitourinary: Vagina normal and uterus normal.  Musculoskeletal: Normal range of motion.  Neurological: She is alert and oriented to person, place, and time. She has normal reflexes.  Skin: Skin is warm and dry.  Psychiatric: She has a normal mood and affect. Her behavior is normal. Judgment and thought content normal.    Prenatal labs: ABO, Rh: AB/POS/-- (09/26 1033) Antibody: NEG (09/26 1033) Rubella: 4.19 (09/26 1033) RPR: NON REAC (01/16 1052)  HBsAg: NEGATIVE (09/26 1033)  HIV: NON REACTIVE (01/16 1052)  GBS: NEGATIVE (03/27 1156)   Assessment/Plan: Patient Active Problem List   Diagnosis Date Noted  . Normal pregnancy 10/23/2013  . Echogenic focus  06/12/2013  . Supervision of other normal pregnancy  03/23/2013   Admit to L&D Induction of labor at term, elective per patient wishes. Favorable cervix. History of vaginal birth x2. Patient consented to IOL in clinic, aware of plan of care.  Patient desires IV medication for pain relief.  Plan pitocin for IOL.  Anticipate NSVD Consult  PRN  Alison Stone Stone 10/23/2013, 8:02 AM

## 2013-10-23 NOTE — Progress Notes (Signed)
Alison Stone is a 28 y.o. [id number] at 4128w4d by LMP admitted for induction of labor due to Elective at term.  Subjective: Patient discouraged minimal progress has been made. Doing well. Reports periods of painful contractions, more intense w/ ambulation.  Objective: BP 108/90  Pulse 94  Temp(Src) 97.9 F (36.6 C) (Oral)  Resp 18  Ht 5' (1.524 m)  Wt 143 lb (64.864 kg)  BMI 27.93 kg/m2  LMP 01/12/2013      FHT:  FHR: 145 bpm, variability: moderate,  accelerations:  Present,  decelerations:  Absent UC:   irregular, every 2-10 minutes SVE:   Dilation: 4 Effacement (%): 70 Station: -3 Exam by:: A Kellianne Ek CNM  Labs: Lab Results  Component Value Date   WBC 8.9 10/23/2013   HGB 8.6* 10/23/2013   HCT 29.4* 10/23/2013   MCV 69.2* 10/23/2013   PLT 197 10/23/2013    Assessment / Plan: Protracted latent phase Not in active labor High fetal station. Increase pitocin at this time. Anticipate active labor, and descent.  Consider AROM when appropriate. Patient and partner aware and agree to plan of care.  Labor: Slow progress, not in active labor Preeclampsia:  NA Fetal Wellbeing:  Category I Pain Control:  Labor support without medications I/D:  n/a Anticipated MOD:  NSVD  Alison Stone Alison Stone 10/23/2013, 5:15 PM  2

## 2013-10-23 NOTE — Progress Notes (Signed)
Alison Stone is a 28 y.o. [id number] at 2445w4d by LMP admitted for induction of labor due to Elective at term.  Subjective: Doing well. On birthing ball.   Objective: BP 124/75  Pulse 103  Temp(Src) 98 F (36.7 C) (Oral)  Resp 18  Ht 5' (1.524 m)  Wt 143 lb (64.864 kg)  BMI 27.93 kg/m2  LMP 01/12/2013      FHT:  FHR: 145 bpm, variability: moderate,  accelerations:  Present,  decelerations:  Absent UC:   regular, every 2-4 minutes SVE:   Dilation: 6 Effacement (%): 70 Station: -2 Exam by:: Wilson SingerWren CNM Pitocin 14 mU  Labs: Lab Results  Component Value Date   WBC 8.9 10/23/2013   HGB 8.6* 10/23/2013   HCT 29.4* 10/23/2013   MCV 69.2* 10/23/2013   PLT 197 10/23/2013    Assessment / Plan: Induction of labor due to term w/ favorable cervix,  progressing well on pitocin Fetal descent  Labor: Progressing on Pitocin, will continue to increase then AROM Preeclampsia:  negative Fetal Wellbeing:  Category I Pain Control:  Fentanyl I/D:  n/a Anticipated MOD:  NSVD  Loc Feinstein Dessa PhiHowell Achillies Buehl 10/23/2013, 7:18 PM

## 2013-10-24 ENCOUNTER — Encounter (HOSPITAL_COMMUNITY): Payer: Self-pay

## 2013-10-24 MED ORDER — OXYCODONE HCL 5 MG/5ML PO SOLN
5.0000 mg | ORAL | Status: DC | PRN
  Administered 2013-10-24: 5 mg via ORAL
  Administered 2013-10-25: 10 mg via ORAL
  Filled 2013-10-24: qty 5
  Filled 2013-10-24: qty 10

## 2013-10-24 MED ORDER — MEASLES, MUMPS & RUBELLA VAC ~~LOC~~ INJ: 0.5000 mL | INJECTION | Freq: Once | SUBCUTANEOUS | Status: DC

## 2013-10-24 MED ORDER — SENNOSIDES-DOCUSATE SODIUM 8.6-50 MG PO TABS
2.0000 | ORAL_TABLET | ORAL | Status: DC
  Administered 2013-10-25: 2 via ORAL
  Filled 2013-10-24 (×2): qty 2

## 2013-10-24 MED ORDER — BUTORPHANOL TARTRATE 1 MG/ML IJ SOLN
2.0000 mg | INTRAMUSCULAR | Status: DC | PRN
  Administered 2013-10-24: 2 mg via INTRAVENOUS
  Filled 2013-10-24: qty 2

## 2013-10-24 MED ORDER — TERBUTALINE SULFATE 1 MG/ML IJ SOLN: 0.2500 mg | Freq: Once | INTRAMUSCULAR | Status: DC | PRN

## 2013-10-24 MED ORDER — ONDANSETRON HCL 4 MG PO TABS: 4.0000 mg | ORAL_TABLET | ORAL | Status: DC | PRN

## 2013-10-24 MED ORDER — IBUPROFEN 100 MG/5ML PO SUSP
600.0000 mg | Freq: Four times a day (QID) | ORAL | Status: DC
  Administered 2013-10-24 – 2013-10-26 (×6): 600 mg via ORAL
  Filled 2013-10-24 (×8): qty 30

## 2013-10-24 MED ORDER — EPHEDRINE 5 MG/ML INJ
10.0000 mg | INTRAVENOUS | Status: DC | PRN
  Filled 2013-10-24: qty 2

## 2013-10-24 MED ORDER — DIBUCAINE 1 % RE OINT: 1.0000 "application " | TOPICAL_OINTMENT | RECTAL | Status: DC | PRN

## 2013-10-24 MED ORDER — OXYCODONE-ACETAMINOPHEN 5-325 MG PO TABS: 1.0000 | ORAL_TABLET | ORAL | Status: DC | PRN

## 2013-10-24 MED ORDER — LANOLIN HYDROUS EX OINT: TOPICAL_OINTMENT | CUTANEOUS | Status: DC | PRN

## 2013-10-24 MED ORDER — IBUPROFEN 100 MG/5ML PO SUSP
600.0000 mg | Freq: Once | ORAL | Status: DC
  Filled 2013-10-24: qty 30

## 2013-10-24 MED ORDER — PHENYLEPHRINE 40 MCG/ML (10ML) SYRINGE FOR IV PUSH (FOR BLOOD PRESSURE SUPPORT)
80.0000 ug | PREFILLED_SYRINGE | INTRAVENOUS | Status: DC | PRN
  Filled 2013-10-24: qty 2

## 2013-10-24 MED ORDER — MAGNESIUM HYDROXIDE 400 MG/5ML PO SUSP
30.0000 mL | ORAL | Status: DC | PRN
  Administered 2013-10-25: 30 mL via ORAL
  Filled 2013-10-24: qty 30

## 2013-10-24 MED ORDER — ACETAMINOPHEN 160 MG/5ML PO SOLN: 325.0000 mg | ORAL | Status: DC | PRN

## 2013-10-24 MED ORDER — DIPHENHYDRAMINE HCL 25 MG PO CAPS: 25.0000 mg | ORAL_CAPSULE | Freq: Four times a day (QID) | ORAL | Status: DC | PRN

## 2013-10-24 MED ORDER — DIPHENHYDRAMINE HCL 50 MG/ML IJ SOLN: 12.5000 mg | INTRAMUSCULAR | Status: DC | PRN

## 2013-10-24 MED ORDER — FENTANYL 2.5 MCG/ML BUPIVACAINE 1/10 % EPIDURAL INFUSION (WH - ANES): 14.0000 mL/h | INTRAMUSCULAR | Status: DC | PRN

## 2013-10-24 MED ORDER — ONDANSETRON HCL 4 MG/2ML IJ SOLN: 4.0000 mg | INTRAMUSCULAR | Status: DC | PRN

## 2013-10-24 MED ORDER — PRENATAL MULTIVITAMIN CH: 1.0000 | ORAL_TABLET | Freq: Every day | ORAL | Status: DC

## 2013-10-24 MED ORDER — IBUPROFEN 600 MG PO TABS: 600.0000 mg | ORAL_TABLET | Freq: Four times a day (QID) | ORAL | Status: DC

## 2013-10-24 MED ORDER — TETANUS-DIPHTH-ACELL PERTUSSIS 5-2.5-18.5 LF-MCG/0.5 IM SUSP: 0.5000 mL | Freq: Once | INTRAMUSCULAR | Status: DC

## 2013-10-24 MED ORDER — OXYTOCIN 40 UNITS IN LACTATED RINGERS INFUSION - SIMPLE MED
1.0000 m[IU]/min | INTRAVENOUS | Status: DC
  Administered 2013-10-24: 666 m[IU]/min via INTRAVENOUS

## 2013-10-24 MED ORDER — LACTATED RINGERS IV SOLN: 500.0000 mL | Freq: Once | INTRAVENOUS | Status: DC

## 2013-10-24 MED ORDER — FERROUS SULFATE 325 (65 FE) MG PO TABS
325.0000 mg | ORAL_TABLET | Freq: Two times a day (BID) | ORAL | Status: DC
  Administered 2013-10-24 – 2013-10-25 (×3): 325 mg via ORAL
  Filled 2013-10-24 (×3): qty 1

## 2013-10-24 MED ORDER — ZOLPIDEM TARTRATE 5 MG PO TABS: 5.0000 mg | ORAL_TABLET | Freq: Every evening | ORAL | Status: DC | PRN

## 2013-10-24 MED ORDER — BENZOCAINE-MENTHOL 20-0.5 % EX AERO
1.0000 "application " | INHALATION_SPRAY | CUTANEOUS | Status: DC | PRN
  Administered 2013-10-24: 1 via TOPICAL
  Filled 2013-10-24: qty 56

## 2013-10-24 MED ORDER — WITCH HAZEL-GLYCERIN EX PADS: 1.0000 "application " | MEDICATED_PAD | CUTANEOUS | Status: DC | PRN

## 2013-10-24 NOTE — Progress Notes (Signed)
Alison Stone is a 28 y.o. [id number] at 2331w5d  by LMP admitted for induction of labor due to Elective at term.  Subjective: Patient comfortable, able.   Objective: BP 117/78  Pulse 107  Temp(Src) 98 F (36.7 C) (Oral)  Resp 18  Ht 5' (1.524 m)  Wt 64.864 kg (143 lb)  BMI 27.93 kg/m2  LMP 01/12/2013      FHT:  FHR: 140 bpm, variability: moderate,  accelerations:  Present,  decelerations:  Absent UC:   irregular, every 2-6 minutes, coupling, mild to palpation SVE:  5/60/-2, dynamic cervix  AROM-->scant, blood-tinged fluid   Assessment / Plan: Induction of labor for term. Early labor   Labor: see above Preeclampsia:  NA Fetal Wellbeing:  Category I Pain Control:  Fentanyl I/D:  n/a Anticipated MOD:  NSVD  Alison CharLisa Jackson-Moore 10/24/2013, 9:58 AM

## 2013-10-24 NOTE — Lactation Note (Signed)
This note was copied from the chart of Boy Corinna Gabameisha Mars. Lactation Consultation Note  Patient Name: Boy Corinna Gabameisha Deckard JXBJY'NToday's Date: 10/24/2013 Reason for consult: Other (Comment) (charting for exclusion)   Maternal Data Formula Feeding for Exclusion: Yes Reason for exclusion: Mother's choice to formula feed on admision  Feeding Feeding Type: Formula Nipple Type: Regular  LATCH Score/Interventions                      Lactation Tools Discussed/Used     Consult Status Consult Status: Complete    Zara ChessJoanne P Nashia Remus 10/24/2013, 7:26 PM

## 2013-10-25 LAB — HEMOGLOBIN AND HEMATOCRIT, BLOOD
HCT: 27.9 % — ABNORMAL LOW (ref 36.0–46.0)
Hemoglobin: 8.2 g/dL — ABNORMAL LOW (ref 12.0–15.0)

## 2013-10-25 NOTE — Progress Notes (Signed)
Post Partum Day 1 Subjective: no complaints  Objective: Blood pressure 111/71, pulse 96, temperature 98.9 F (37.2 C), temperature source Oral, resp. rate 17, height 5' (1.524 m), weight 143 lb (64.864 kg), last menstrual period 01/12/2013, unknown if currently breastfeeding.  Physical Exam:  General: alert and no distress Lochia: appropriate Uterine Fundus: firm Incision: None DVT Evaluation: No evidence of DVT seen on physical exam.   Recent Labs  10/23/13 0730  HGB 8.6*  HCT 29.4*    Assessment/Plan: Anemia.  Chronic.  Clinically stable. Plan for discharge tomorrow   LOS: 2 days   Brock BadCharles A Anjoli Diemer 10/25/2013, 5:33 AM

## 2013-10-26 MED ORDER — IBUPROFEN 100 MG/5ML PO SUSP: 600.0000 mg | Freq: Four times a day (QID) | ORAL | Status: DC | PRN

## 2013-10-26 MED ORDER — FUSION PLUS PO CAPS
1.0000 | ORAL_CAPSULE | Freq: Every day | ORAL | Status: DC
Start: 2013-10-26 — End: 2015-08-10

## 2013-10-26 MED ORDER — OXYCODONE HCL 5 MG/5ML PO SOLN: 5.0000 mg | ORAL | Status: DC | PRN

## 2013-10-26 NOTE — Discharge Summary (Signed)
Obstetric Discharge Summary Reason for Admission: induction of labor Prenatal Procedures: NST and ultrasound Intrapartum Procedures: spontaneous vaginal delivery Postpartum Procedures: none Complications-Operative and Postpartum: none Hemoglobin  Date Value Ref Range Status  10/25/2013 8.2* 12.0 - 15.0 g/dL Final     HCT  Date Value Ref Range Status  10/25/2013 27.9* 36.0 - 46.0 % Final    Physical Exam:  General: alert and no distress Lochia: appropriate Uterine Fundus: firm Incision: none DVT Evaluation: No evidence of DVT seen on physical exam.  Discharge Diagnoses: Term Pregnancy-delivered  Discharge Information: Date: 10/26/2013 Activity: pelvic rest Diet: routine Medications: PNV, Ibuprofen, Colace, Iron and Percocet Condition: stable Instructions: refer to practice specific booklet Discharge to: home Follow-up Information   Follow up with Antionette CharJACKSON-MOORE,LISA A, MD In 2 weeks.   Specialty:  Obstetrics and Gynecology   Contact information:   [address] [place] [id number] [phone]       Newborn Data: Live born female  Birth Weight: 8 lb 2.9 oz (3710 g) APGAR: 8, 9  Home with mother.  Alison Stone 10/26/2013, 6:34 AM

## 2013-10-26 NOTE — Progress Notes (Signed)
UR chart review completed.  

## 2013-10-26 NOTE — Progress Notes (Addendum)
Post Partum Day 2 Subjective: no complaints  Objective: Blood pressure 137/75, pulse 99, temperature 97.5 F (36.4 C), temperature source Oral, resp. rate 18, height 5' (1.524 m), weight 143 lb (64.864 kg), last menstrual period 01/12/2013, SpO2 97.00%, unknown if currently breastfeeding.  Physical Exam:  General: alert and no distress Lochia: appropriate Uterine Fundus: firm Incision: none DVT Evaluation: No evidence of DVT seen on physical exam.   Recent Labs  10/23/13 0730 10/25/13 0625  HGB 8.6* 8.2*  HCT 29.4* 27.9*    Assessment/Plan: Anemia.  Chronic.  Clinically stable.  Iron Rx. Discharge home   LOS: 3 days   Brock BadCharles A Stone 10/26/2013, 6:27 AM

## 2013-12-14 ENCOUNTER — Ambulatory Visit (INDEPENDENT_AMBULATORY_CARE_PROVIDER_SITE_OTHER): Payer: Medicaid Other | Admitting: Advanced Practice Midwife

## 2013-12-14 VITALS — BP 114/74 | HR 82 | Temp 98.5°F | Wt 129.0 lb

## 2013-12-14 DIAGNOSIS — Z30011 Encounter for initial prescription of contraceptive pills: Secondary | ICD-10-CM

## 2013-12-14 MED ORDER — NORETHIN ACE-ETH ESTRAD-FE 1-20 MG-MCG(24) PO TABS: 1.0000 | ORAL_TABLET | Freq: Every day | ORAL | Status: DC

## 2013-12-14 NOTE — Progress Notes (Signed)
Subjective:     Alison Stone is a 28 y.o. female who presents for a postpartum visit. She is 6 weeks postpartum following a spontaneous vaginal delivery. I have fully reviewed the prenatal and intrapartum course. The delivery was at term gestational weeks. Outcome: spontaneous vaginal delivery. Anesthesia: epidural. Postpartum course has been stable. Baby's course has been stable. Baby is feeding by bottle - .. Bleeding brown and scant. Bowel function is normal. Bladder function is normal. Patient is not sexually active. Contraception method is OCP (estrogen/progesterone). Postpartum depression screening: negative.  The following portions of the patient's history were reviewed and updated as appropriate: allergies, current medications, past family history, past medical history, past social history, past surgical history and problem list.  Review of Systems . Constitutional: negative for fatigue and weight loss Respiratory: negative for cough and wheezing Cardiovascular: negative for chest pain, fatigue and palpitations Gastrointestinal: negative for abdominal pain and change in bowel habits Genitourinary:negative Integument/breast: negative for nipple discharge Musculoskeletal:negative for myalgias Neurological: negative for gait problems and tremors Behavioral/Psych: negative for abusive relationship, depression Endocrine: negative for temperature intolerance       Objective:    BP 114/74  Pulse 82  Temp(Src) 98.5 F (36.9 C)  Wt 129 lb (58.514 kg)  General:  alert and cooperative   Breasts:  inspection negative, no nipple discharge or bleeding, no masses or nodularity palpable        Abdomen: soft, non-tender; bowel sounds normal; no masses,  no organomegaly   Vulva:  normal  Vagina: normal vagina  Cervix:  not evaluated                   Assessment:     6 postpartum exam. Pap smear not done at today's visit.   Plan:    1. Contraception: OCP  (estrogen/progesterone)  Meds ordered this encounter  Medications  . Norethindrone Acetate-Ethinyl Estrad-FE (LOESTRIN 24 FE) 1-20 MG-MCG(24) tablet    Sig: Take 1 tablet by mouth daily.    Dispense:  1 Package    Refill:  11    Order Specific Question:  Supervising Cashay Manganelli    Answer:  HARPER, CHARLES A [[id number]]    2. Due 2017 for pap smear 3. Follow up in: 1 year or as needed.   Amy Wilson SingerWren CNM

## 2014-04-12 ENCOUNTER — Other Ambulatory Visit: Payer: Self-pay

## 2014-04-29 ENCOUNTER — Encounter (HOSPITAL_COMMUNITY): Payer: Self-pay

## 2015-04-01 ENCOUNTER — Ambulatory Visit: Payer: Medicaid Other | Admitting: Certified Nurse Midwife

## 2015-04-22 ENCOUNTER — Ambulatory Visit: Payer: Medicaid Other | Admitting: Certified Nurse Midwife

## 2015-05-14 ENCOUNTER — Encounter: Payer: Self-pay | Admitting: Certified Nurse Midwife

## 2015-05-14 ENCOUNTER — Ambulatory Visit (INDEPENDENT_AMBULATORY_CARE_PROVIDER_SITE_OTHER): Payer: Medicaid Other | Admitting: Certified Nurse Midwife

## 2015-05-14 VITALS — BP 116/79 | HR 86 | Temp 98.0°F | Ht 60.0 in | Wt 131.0 lb

## 2015-05-14 DIAGNOSIS — Z01419 Encounter for gynecological examination (general) (routine) without abnormal findings: Secondary | ICD-10-CM

## 2015-05-14 DIAGNOSIS — B373 Candidiasis of vulva and vagina: Secondary | ICD-10-CM

## 2015-05-14 DIAGNOSIS — N760 Acute vaginitis: Secondary | ICD-10-CM

## 2015-05-14 DIAGNOSIS — Z30011 Encounter for initial prescription of contraceptive pills: Secondary | ICD-10-CM

## 2015-05-14 DIAGNOSIS — B9689 Other specified bacterial agents as the cause of diseases classified elsewhere: Secondary | ICD-10-CM

## 2015-05-14 DIAGNOSIS — B3731 Acute candidiasis of vulva and vagina: Secondary | ICD-10-CM

## 2015-05-14 DIAGNOSIS — Z0000 Encounter for general adult medical examination without abnormal findings: Secondary | ICD-10-CM | POA: Diagnosis not present

## 2015-05-14 MED ORDER — NORETHIN-ETH ESTRAD-FE BIPHAS 1 MG-10 MCG / 10 MCG PO TABS: 1.0000 | ORAL_TABLET | Freq: Every day | ORAL | Status: DC

## 2015-05-14 MED ORDER — METRONIDAZOLE 500 MG PO TABS: 500.0000 mg | ORAL_TABLET | Freq: Two times a day (BID) | ORAL | Status: DC

## 2015-05-14 MED ORDER — FLUCONAZOLE 100 MG PO TABS: 100.0000 mg | ORAL_TABLET | Freq: Once | ORAL | Status: DC

## 2015-05-14 NOTE — Progress Notes (Signed)
Patient ID: Alison Stone, female   DOB: 1985-10-29, 29 y.o.   MRN: [id number]    Subjective:      Alison Stone is a 29 y.o. female here for a routine exam.  Current complaints: vaginal discharge with odor, denies any itching or burning with urination, denies any vaginal pain with sexual intercourse or low back pain.  Has one year old son.  Currently sexually active. Periods are montly lasting 5 days, denies any heavy bleeding, clots or cramping.  Is not sure about birth control at this point.    Declines blood STD testing.    Personal health questionnaire:  Is patient Ashkenazi Jewish, have a family history of breast and/or ovarian cancer: no Is there a family history of uterine cancer diagnosed at age < 25, gastrointestinal cancer, urinary tract cancer, family member who is a Personnel officer syndrome-associated carrier: no Is the patient overweight and hypertensive, family history of diabetes, personal history of gestational diabetes, preeclampsia or PCOS: no Is patient over 38, have PCOS,  family history of premature CHD under age 48, diabetes, smoke, have hypertension or peripheral artery disease:  Yes, MGF: MI, CVA, DM, HTN on maternal side At any time, has a partner hit, kicked or otherwise hurt or frightened you?: no Over the past 2 weeks, have you felt down, depressed or hopeless?: no Over the past 2 weeks, have you felt little interest or pleasure in doing things?:no   Gynecologic History Patient's last menstrual period was 04/14/2015. Contraception: none Last Pap: 02/2013. Results were: normal Last mammogram: N/A.   Obstetric History OB History  Gravida Para Term Preterm AB SAB TAB Ectopic Multiple Living  0 1 1 0 0 0 4    # Outcome Date GA Lbr Len/2nd Weight Sex Delivery Anes PTL Lv  5 Term 10/24/13 [redacted]w[redacted]d 04:24 / 00:20 8 lb 2.9 oz (3.71 kg) M Vag-Spont None  Y  4 Term 08/14/11 [redacted]w[redacted]d 03:55 / 02:00 8 lb 2.5 oz (3.7 kg) F Vag-Spont None  Y     Comments: none  3 Term 2009  [redacted]w[redacted]d 24:00 7 lb (3.175 kg) F Vag-Spont None  Y  2 SAB 2006          1 Term 2005 [redacted]w[redacted]d 24:00 8 lb (3.629 kg) F Vag-Spont None  Y     Comments: System Generated. Please review and update pregnancy details.      Past Medical History  Diagnosis Date  . 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 Medical history non-contributory     Past Surgical History  Procedure Laterality Date  . Throat surgery      cyst blocking airway     Current outpatient prescriptions:  .  aspirin-acetaminophen-caffeine (EXCEDRIN MIGRAINE) 250-250-65 MG tablet, Take by mouth every 6 (six) hours as needed for headache., Disp: , Rfl:  .  ibuprofen (ADVIL,MOTRIN) 100 MG/5ML suspension, Take 30 mLs (600 mg total) by mouth every 6 (six) hours as needed. (Patient not taking: Reported on 05/14/2015), Disp: 473 mL, Rfl: 5 .  Iron-FA-B Cmp-C-Biot-Probiotic (FUSION PLUS) CAPS, Take 1 capsule by mouth daily before breakfast. (Patient not taking: Reported on 05/14/2015), Disp: 30 capsule, Rfl: 5 .  Norethindrone Acetate-Ethinyl Estrad-FE (LOESTRIN 24 FE) 1-20 MG-MCG(24) tablet, Take 1 tablet by mouth daily. (Patient not taking: Reported on 05/14/2015), Disp: 1 Package, Rfl: 11 .  oxyCODONE (ROXICODONE) 5 MG/5ML solution, Take 5-10 mLs (5-10 mg total) by mouth every 4 (  four) hours as needed for moderate pain or severe pain. (Patient not taking: Reported on 05/14/2015), Disp: 500 mL, Rfl: 0 .  PRENATAL VITAMINS PO, Take by mouth.  , Disp: , Rfl:  No Known Allergies  Social History  Substance Use Topics  . Smoking status: Never Smoker   . Smokeless tobacco: Never Used  . Alcohol Use: No    Family History  Problem Relation Age of Onset  . Asthma Maternal Aunt   . Kidney disease Maternal Aunt   . Migraines Maternal Aunt   . Nephrolithiasis Paternal Aunt   . Migraines Paternal Aunt   . Stroke Maternal Grandfather   . Hypertension Maternal Grandfather   . Heart failure Maternal Grandfather        Review of Systems  Constitutional: negative for fatigue and weight loss Respiratory: negative for cough and wheezing Cardiovascular: negative for chest pain, fatigue and palpitations Gastrointestinal: negative for abdominal pain and change in bowel habits Musculoskeletal:negative for myalgias Neurological: negative for gait problems and tremors Behavioral/Psych: negative for abusive relationship, depression Endocrine: negative for temperature intolerance   Genitourinary:negative for abnormal menstrual periods, genital lesions, hot flashes, sexual problems and vaginal discharge Integument/breast: negative for breast lump, breast tenderness, nipple discharge and skin lesion(s)    Objective:       BP 116/79 mmHg  Pulse 86  Temp(Src) 98 F (36.7 C)  Ht 5' (1.524 m)  Wt 131 lb (59.421 kg)  BMI 25.58 kg/m2  LMP 04/14/2015 General:   alert  Skin:   no rash or abnormalities  Lungs:   clear to auscultation bilaterally  Heart:   regular rate and rhythm, S1, S2 normal, no murmur, click, rub or gallop  Breasts:   normal without suspicious masses, skin or nipple changes or axillary nodes  Abdomen:  normal findings: no organomegaly, soft, non-tender and no hernia  Pelvis:  External genitalia: normal general appearance Urinary system: urethral meatus normal and bladder without fullness, nontender Vaginal: normal without tenderness, induration or masses, + green-yellow vaginal discharge, frothy Cervix: normal appearance, + friable with touch Adnexa: normal bimanual exam Uterus: anteverted and non-tender, normal size   Lab Review Urine pregnancy test Labs reviewed yes Radiologic studies reviewed no  50% of 30 min visit spent on counseling and coordination of care.   Assessment:    Healthy female exam.   Vaginitis  High risk sexual behaviors  Plan:    Education reviewed: depression evaluation, low fat, low cholesterol diet, safe sex/STD prevention, self breast exams, skin  cancer screening and weight bearing exercise. Contraception: OCP (estrogen/progesterone). Follow up in: 1 year.   Meds ordered this encounter  Medications  . aspirin-acetaminophen-caffeine (EXCEDRIN MIGRAINE) 250-250-65 MG tablet    Sig: Take by mouth every 6 (six) hours as needed for headache.   No orders of the defined types were placed in this encounter.    Possible management options include: nuva ring for contraception

## 2015-05-15 LAB — PAP IG W/ RFLX HPV ASCU

## 2015-05-18 LAB — SURESWAB, VAGINOSIS/VAGINITIS PLUS
Atopobium vaginae: 7.6 Log (cells/mL)
C. PARAPSILOSIS, DNA: NOT DETECTED
C. TRACHOMATIS RNA, TMA: NOT DETECTED
C. albicans, DNA: NOT DETECTED
C. glabrata, DNA: NOT DETECTED
C. tropicalis, DNA: NOT DETECTED
Gardnerella vaginalis: >8 Log (cells/mL)
LACTOBACILLUS SPECIES: NOT DETECTED Log (cells/mL)
MEGASPHAERA SPECIES: 7.8 Log (cells/mL)
N. GONORRHOEAE RNA, TMA: NOT DETECTED
T. vaginalis RNA, QL TMA: NOT DETECTED

## 2015-05-19 ENCOUNTER — Other Ambulatory Visit: Payer: Self-pay | Admitting: Certified Nurse Midwife

## 2015-08-10 ENCOUNTER — Emergency Department (HOSPITAL_COMMUNITY)
Admission: EM | Admit: 2015-08-10 | Discharge: 2015-08-10 | Disposition: A | Discharge status: Home / Self Care | Payer: Medicaid Other | Attending: Emergency Medicine | Admitting: Emergency Medicine

## 2015-08-10 ENCOUNTER — Encounter (HOSPITAL_COMMUNITY): Payer: Self-pay | Admitting: *Deleted

## 2015-08-10 DIAGNOSIS — Z793 Long term (current) use of hormonal contraceptives: Secondary | ICD-10-CM | POA: Insufficient documentation

## 2015-08-10 DIAGNOSIS — J36 Peritonsillar abscess: Secondary | ICD-10-CM | POA: Diagnosis not present

## 2015-08-10 DIAGNOSIS — Z792 Long term (current) use of antibiotics: Secondary | ICD-10-CM | POA: Insufficient documentation

## 2015-08-10 DIAGNOSIS — Z8619 Personal history of other infectious and parasitic diseases: Secondary | ICD-10-CM | POA: Insufficient documentation

## 2015-08-10 DIAGNOSIS — Z7982 Long term (current) use of aspirin: Secondary | ICD-10-CM | POA: Diagnosis not present

## 2015-08-10 DIAGNOSIS — J029 Acute pharyngitis, unspecified: Secondary | ICD-10-CM | POA: Diagnosis present

## 2015-08-10 LAB — RAPID STREP SCREEN (MED CTR MEBANE ONLY): Streptococcus, Group A Screen (Direct): NEGATIVE

## 2015-08-10 MED ORDER — SODIUM CHLORIDE 0.9 % IV BOLUS (SEPSIS)
1000.0000 mL | Freq: Once | INTRAVENOUS | Status: AC
  Administered 2015-08-10: 1000 mL via INTRAVENOUS

## 2015-08-10 MED ORDER — LIDOCAINE HCL 4 % EX SOLN: 0.0000 mL | Freq: Once | CUTANEOUS | Status: DC | PRN

## 2015-08-10 MED ORDER — METHYLPREDNISOLONE SODIUM SUCC 125 MG IJ SOLR
125.0000 mg | Freq: Once | INTRAMUSCULAR | Status: AC
  Administered 2015-08-10: 125 mg via INTRAVENOUS
  Filled 2015-08-10: qty 2

## 2015-08-10 MED ORDER — FENTANYL CITRATE (PF) 100 MCG/2ML IJ SOLN
50.0000 ug | Freq: Once | INTRAMUSCULAR | Status: AC
  Administered 2015-08-10: 50 ug via INTRAVENOUS
  Filled 2015-08-10: qty 2

## 2015-08-10 MED ORDER — FENTANYL CITRATE (PF) 100 MCG/2ML IJ SOLN: 100.0000 ug | Freq: Once | INTRAMUSCULAR | Status: DC

## 2015-08-10 MED ORDER — OXYCODONE-ACETAMINOPHEN 5-325 MG PO TABS: 1.0000 | ORAL_TABLET | ORAL | Status: DC | PRN

## 2015-08-10 MED ORDER — OXYCODONE-ACETAMINOPHEN 5-325 MG PO TABS: 2.0000 | ORAL_TABLET | Freq: Once | ORAL | Status: DC

## 2015-08-10 MED ORDER — LIDOCAINE-EPINEPHRINE (PF) 1 %-1:200000 IJ SOLN: 0.0000 mL | Freq: Once | INTRAMUSCULAR | Status: DC | PRN

## 2015-08-10 MED ORDER — TRIPLE ANTIBIOTIC 3.5-400-5000 EX OINT: 1.0000 "application " | TOPICAL_OINTMENT | Freq: Once | CUTANEOUS | Status: DC | PRN

## 2015-08-10 MED ORDER — CLINDAMYCIN PHOSPHATE 600 MG/50ML IV SOLN
600.0000 mg | Freq: Once | INTRAVENOUS | Status: AC
  Administered 2015-08-10: 600 mg via INTRAVENOUS
  Filled 2015-08-10: qty 50

## 2015-08-10 MED ORDER — OXYMETAZOLINE HCL 0.05 % NA SOLN: 1.0000 | Freq: Once | NASAL | Status: DC | PRN

## 2015-08-10 MED ORDER — LIDOCAINE HCL 2 % EX GEL: 1.0000 "application " | Freq: Once | CUTANEOUS | Status: DC | PRN

## 2015-08-10 MED ORDER — SILVER NITRATE-POT NITRATE 75-25 % EX MISC: 1.0000 | Freq: Once | CUTANEOUS | Status: DC | PRN

## 2015-08-10 MED ORDER — BENZOCAINE 20 % MT SOLN: Freq: Once | OROMUCOSAL | Status: DC

## 2015-08-10 MED ORDER — CLINDAMYCIN HCL 150 MG PO CAPS: 450.0000 mg | ORAL_CAPSULE | Freq: Three times a day (TID) | ORAL | Status: DC

## 2015-08-10 NOTE — Discharge Instructions (Signed)
1. Medications: Clindamycin, percocet, usual home medications 2. Treatment: rest, drink plenty of fluids,  3. Follow Up: Please followup with ENT TOMORROW for recheck; Please return to the ER sooner for difficult swallowing, difficulty breathing or worsening symptoms   Peritonsillar Abscess A peritonsillar abscess is a collection of yellowish-white fluid (pus) in the back of the throat behind the tonsils. It usually occurs when an infection of the throat or tonsils (tonsillitis) spreads into the tissues around the tonsils. CAUSES The infection that leads to a peritonsillar abscess is usually caused by streptococcal bacteria.  SIGNS AND SYMPTOMS  Sore throat, often with pain on just one side.  Swelling and tenderness of the glands (lymph nodes) in the neck.  Difficulty swallowing.  Difficulty opening your mouth.  Fever.  Chills.  Drooling because of difficulty swallowing saliva.  Headache.  Changes in your voice.  Bad breath. DIAGNOSIS Your health care provider will take your medical history and do a physical exam. Imaging tests may be done, such as an ultrasound or CT scan. A sample of pus may be removed from the abscess using a needle (needle aspiration) or by swabbing the back of your throat. This sample will be sent to a lab for testing. TREATMENT Treatment usually involves draining the pus from the abscess. This may be done through needle aspiration or by making an incision in the abscess. You will also likely need to take antibiotic medicine. HOME CARE INSTRUCTIONS  Rest as much as possible and get plenty of sleep.  Take medicines only as directed by your health care provider.  If you were prescribed an antibiotic medicine, finish it all even if you start to feel better.  If your abscess was drained by your health care provider, gargle with a mixture of salt and warm water:  Mix 1 tsp of salt in 8 oz of warm water.  Gargle with this mixture four times per day or as  needed for comfort.  Do not swallow this mixture.  Drink plenty of fluids.  While your throat is sore, eat soft or liquid foods, such as frozen ice pops and ice cream.  Keep all follow-up visits as directed by your health care provider. This is important. SEEK MEDICAL CARE IF:  You have increased pain, swelling, redness, or drainage in your throat.  You develop a headache, a lack of energy (lethargy), or generalized feelings of illness.  You have a fever.  You feel dizzy.  You have difficulty swallowing or eating.  You show signs of becoming dehydrated, such as:  Light-headedness when standing.  Decreased urine output.  A fast heart rate.  Dry mouth. SEEK IMMEDIATE MEDICAL CARE IF:   You have difficulty talking or breathing, or you find it easier to breathe when you lean forward.  You are coughing up blood or vomiting blood.  You have severe throat pain that is not helped by medicines.  You start to drool.   This information is not intended to replace advice given to you by your health care provider. Make sure you discuss any questions you have with your health care provider.   Document Released: 06/14/2005 Document Revised: 07/05/2014 Document Reviewed: 01/28/2014 Elsevier Interactive Patient Education 2016 [employer].    Emergency Department Resource Guide 1) Find a Doctor and Pay Out of Pocket Although you won't have to find out who is covered by your insurance plan, it is a good idea to ask around and get recommendations. You will then need to call the office  and see if the doctor you have chosen will accept you as a new patient and what types of options they offer for patients who are self-pay. Some doctors offer discounts or will set up payment plans for their patients who do not have insurance, but you will need to ask so you aren't surprised when you get to your appointment.  2) Contact Your Local Health Department Not all health departments have doctors  that can see patients for sick visits, but many do, so it is worth a call to see if yours does. If you don't know where your local health department is, you can check in your phone book. The CDC also has a tool to help you locate your state's health department, and many state websites also have listings of all of their local health departments.  3) Find a Walk-in Clinic If your illness is not likely to be very severe or complicated, you may want to try a walk in clinic. These are popping up all over the country in pharmacies, drugstores, and shopping centers. They're usually staffed by nurse practitioners or physician assistants that have been trained to treat common illnesses and complaints. They're usually fairly quick and inexpensive. However, if you have serious medical issues or chronic medical problems, these are probably not your best option.  No Primary Care Doctor: - Call Health Connect at  [phone] - they can help you locate a primary care doctor that  accepts your insurance, provides certain services, etc. - Physician Referral Service- [phone]  Chronic Pain Problems: Organization         Address  Phone   Notes  [facility]  [phone] Patients need to be referred by their primary care doctor.   Medication Assistance: Organization         Address  Phone   Notes  [facility] Medication [facility] [address] [place] [place], [place] [address] [phone] --Must be a resident of [facility] -- Must have NO insurance coverage whatsoever (no Medicaid/ Medicare, etc.) -- The pt. MUST have a primary care doctor that directs their care regularly and follows them in the community   MedAssist  [phone]   [employer]  [phone]    Agencies that provide inexpensive medical care: Organization         Address  Phone   Notes  [facility]  [phone]   [facility]    [phone]   [facility] [address] [place], [place] [address] [phone]   [facility] [address] [place], [place] [phone]   Planned Parenthood    [phone]   [facility]    [phone]   [facility]  [address], [place] Phone:  [phone], Fax:  [phone] Hours of Operation:  9 am - 6 pm, M-F.  Also accepts Medicaid/Medicare and self-pay.  [facility]  [address], [place] Phone: [phone], Fax: [phone]. Hours of Operation:  8:30 am - 5:30 pm, M-F.  Also accepts Medicaid and self-pay.  [facility] [place] [address], [place] Phone: [phone]   [facility] [address] Natasha Bence [place], [place] [phone], Ext. [phone] Mondays & Thursdays: 7-9 AM.  First 15 patients are seen on a first come, first serve basis.    Medicaid-accepting [place] Providers:  Organization         Address  Phone   Notes  [facility] [address], [place] [phone] Also accepts self-pay patients.  [facility] [address] Laurell Josephs [place], [place]  [phone]   [facility] [address], [place] [phone]   [facility] [address] [place] [phone]   Renaye Rakers [address], [place]   [phone] Only accepts [place] Access [place] patients after they have their name applied to their card.   Self-Pay (no insurance) in [facility]:  Organization         Address  Phone   Notes  Sickle Cell Patients, [facility] [address] [place], [place] [phone]   [facility] [address], [place] [phone]   [facility] [place]  [address], [place] [phone]   [facility]/Dr.  Osei-Bonsu  [address] [place] or [address], [place] [phone] Phone number for both [place] and [place] locations is the same.  [facility] [address] [place] [phone]   [facility] [address], [place] or [address] [phone]   [facility] [address] [place] [phone], phone; [phone], fax Sees patients 1st and 3rd Saturday of every month.  Must not qualify for public or private insurance (i.e. Medicaid, Medicare, [place] Health Choice, Veterans' Benefits)  Household income should be no more than 200% of the poverty level The clinic cannot treat you if you are pregnant or think you are pregnant  Sexually transmitted diseases are not treated at the clinic.    Dental Care: Organization         Address  Phone  Notes  [facility] Department of [facility] [address] [place], [place] [phone] Accepts children up to age 63 who are enrolled in [place] or [place] Health Choice; pregnant women with a Medicaid card; and children who have applied for Medicaid or [place] Health Choice, but were declined, whose parents can pay a reduced fee at time of service.  [facility] Department of [facility]  [address], [place] [phone] Accepts children up to age 74 who are enrolled in [place] or [place] Health Choice; pregnant women with a Medicaid card; and children who have applied for Medicaid or [place] Health Choice, but were declined, whose parents can pay a reduced fee at time of service.  [facility]  [address] [place], [place] [phone] Patients are seen by appointment only. Walk-ins are not accepted. [facility] will see patients 15 years of age and older. Monday - Tuesday (8am-5pm) Most Wednesdays (8:30-5pm) $30 per visit, cash only  [facility] Adult  Dental Access PROGRAM  [address], [facility] [phone] Patients are seen by appointment only. Walk-ins are not accepted. [facility] will see patients 10 years of age and older. One Wednesday Evening (Monthly: Volunteer Based).  $30 per visit, cash only  [employer]  [phone] for adults; Children under age 21, call Graduate Pediatric Dentistry at [phone]. Children aged 34-14, please call [phone] to request a pediatric application.  Dental services are provided in all areas of dental care including fillings, crowns and bridges, complete and partial dentures, implants, gum treatment, root canals, and extractions. Preventive care is also provided. Treatment is provided to both adults and children. Patients are selected via a lottery and there is often a waiting list.   [facility] [address], [place]  [phone] [email or web address]   [facility] [address] [place], [place] [phone], Ext. [phone] Second and Fourth Thursday of each month, opens at 6:30 AM; Clinic ends at 9 AM.  Patients are seen on a first-come first-served basis, and a limited number are seen during each clinic.   [facility]  [address] Ether Griffins [place], [place] [phone]   Eligibility Requirements You must have lived in [place], [place], or [place] counties for at least the last three months.   You cannot be eligible for state or federal sponsored [place], including [employer], [place], or [employer].   You generally cannot be eligible for healthcare insurance through your employer.    How to apply: Eligibility screenings are held every Tuesday and Wednesday afternoon from 1:00 pm until 4:00 pm. You do not need an appointment for the interview!  [facility] [address] [place], [place] [phone]   [facility] Health Department  [phone]   [facility]  Health Department  [phone]   [facility] Health Department  [phone]    Behavioral Health Resources in the Community: Intensive Outpatient Programs Organization         Address  Phone  Notes  [facility] [address], [place], [place] [phone]   [facility] Outpatient [address] [place], [place] [phone]   [facility] [address] [place], [place]  [phone]   [facility] Mental Health [address],  [place], [place] [phone] or [phone]   Substance Abuse Resources Organization         Address  Phone  Notes  [facility]  [phone]   [facility]  [phone]   [facility]  [phone]   Floydene Flock  [phone]   Residential & Outpatient Substance Abuse Program  [phone]   Psychological Services Organization         Address  Phone  Notes  [facility] Behavioral Health  [phone]   [facility] Services  [phone]   [facility] Mental Health [address], [place] [phone] or [phone]    Mobile Crisis Teams Organization         Address  Phone  Notes  [facility], Mobile Crisis Care Unit  [phone]   [facility]  [address] [place], [place] [phone]   Doristine Locks [address] [place] [phone]    Self-Help/Support Groups Organization         Address  Phone             Notes  [facility] [place] - variety of support groups  336- I7437963 Call for more information  Narcotics Anonymous (NA), [facility] [address], [employer] [place]  2 meetings at this location   Statistician         Address  Phone  Notes  [facility] [address],    [place]  [phone]   [facility]  [address], [place] [address], [place], [place] [phone]  [facility] Residential Treatment  Facility [address] [place], [place] [phone] Admissions: 8am-3pm M-F  [facility] [address],    [place], [place] [phone]   [facility] [address] [place], [place], [place] [phone]   [facility] [address],  [place], [place] [phone]   [facility] - Intensive Outpatient [address] Laurell Josephs [address], [place], [place] [phone]   [facility] ([facility] [address] [place]  [place], [place] [phone] or [phone]   [facility] ([facility]) [address], [place], [place] [phone] Accepts Medicaid  [facility] [place] [address]  [place] [phone] Substance Abuse/Addiction Treatment   [facility] Organization         Address  Phone  Notes  [facility]  [phone]   Angie Fava, PhD [address] Ervin Knack [place], [place]   [phone] or [phone]   [facility] Behavioral   [address] [place], [place] [phone]   [facility] [address] [place], [place] [phone] Insurance/Medicaid/sponsorship through [facility] and Families [address]                                    [place], [place] [phone] Therapy/tele-psych/case  [facility] [address], [place] [phone]    Dr. Lolly Mustache  [phone]   [facility]  [employer] [facility] Dept. 1) [address] [place] 2) [address], [place] 3)  [address], [place] [phone]  [phone]   [facility] Child Abuse Hotline (856)030-9758 or [phone] (After Hours)

## 2015-08-10 NOTE — ED Notes (Signed)
Declined W/C at D/C and was escorted to lobby by RN. 

## 2015-08-10 NOTE — ED Notes (Signed)
Pt reports sore throat started on Friday. Pt denies a Hx of strep throat.

## 2015-08-10 NOTE — Consult Note (Signed)
Reason for Consult:tonsil infection Referring Physician: er  Alison Stone is an 30 y.o. female.  HPI: hx of 3 days of right sided sore throat. She has not had tonsillitis previous. She has not had any treament. She is still able to get fluids down. No ear pain. No breathing issues.   Past Medical History  Diagnosis Date  . 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 Medical history non-contributory     Past Surgical History  Procedure Laterality Date  . Throat surgery      cyst blocking airway    Family History  Problem Relation Age of Onset  . Asthma Maternal Aunt   . Kidney disease Maternal Aunt   . Migraines Maternal Aunt   . Nephrolithiasis Paternal Aunt   . Migraines Paternal Aunt   . Stroke Maternal Grandfather   . Hypertension Maternal Grandfather   . Heart failure Maternal Grandfather     Social History:  reports that she has never smoked. She has never used smokeless tobacco. She reports that she does not drink alcohol or use illicit drugs.  Allergies: No Known Allergies  Medications: I have reviewed the patient's current medications.  Results for orders placed or performed during the hospital encounter of 08/10/15 (from the past 48 hour(s))  Rapid strep screen     Status: None   Collection Time: 08/10/15  9:52 AM  Result Value Ref Range   Streptococcus, Group A Screen (Direct) NEGATIVE NEGATIVE    Comment: (NOTE) A Rapid Antigen test may result negative if the antigen level in the sample is below the detection level of this test. The FDA has not cleared this test as a stand-alone test therefore the rapid antigen negative result has reflexed to a Group A Strep culture.     No results found.  ROS Blood pressure 131/82, pulse 114, temperature 99.6 F (37.6 C), temperature source Oral, resp. rate 17, height 5' (1.524 m), weight 58.968 kg (130 lb), SpO2 99 %, unknown if currently breastfeeding. Physical Exam  Constitutional:  She appears well-developed and well-nourished.  HENT:  Head: Normocephalic and atraumatic.  Nose: Nose normal.  She is not toxic. She is talking with not much change in voice. She has some trismus but the tonsils are visulaized. The right is swollen and uvula slightly edematous. There is not a tight firmness to the right peritonsillar space. No breathing problem.   Eyes: Conjunctivae are normal. Pupils are equal, round, and reactive to light.  Neck: Normal range of motion. Neck supple.    Assessment/Plan: Right possible PTA- we discussed options of IV abx and steroids and give her one day to see if this will improve. We discussed I/D. She would like to try the medical treatment first and follow in the office tomorrow if not better. Send her home on Augmentin.   Alison Stone 08/10/2015, 11:28 AM

## 2015-08-10 NOTE — ED Provider Notes (Signed)
CSN: [id number]     Arrival date & time 08/10/15  0945 History  By signing my name below, I, Freida Busman, attest that this documentation has been prepared under the direction and in the presence of non-physician practitioner, Dierdre Forth, PA-C. Electronically Signed: Freida Busman, Scribe. 08/10/2015. 10:34 AM.      Chief Complaint  Patient presents with  . Sore Throat   The history is provided by the patient and medical records. No language interpreter was used.     HPI Comments:  SHAKETA SERAFIN is a 30 y.o. female who presents to the Emergency Department complaining of sore throat x 3 days with moderate-severe pain. She has been taking theraflu and cough drops without relief. She denies HA, ear pain, trismus and cough. She reports sick contacts --her children; she has 4 under the age of 71.  Pt also with subjective fevers at home.  Decreased PO intake due to pain.  Denies Hx of HIV, DM, steroid usage.     Past Medical History  Diagnosis Date  . 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 Medical history non-contributory    Past Surgical History  Procedure Laterality Date  . Throat surgery      cyst blocking airway   Family History  Problem Relation Age of Onset  . Asthma Maternal Aunt   . Kidney disease Maternal Aunt   . Migraines Maternal Aunt   . Nephrolithiasis Paternal Aunt   . Migraines Paternal Aunt   . Stroke Maternal Grandfather   . Hypertension Maternal Grandfather   . Heart failure Maternal Grandfather    Social History  Substance Use Topics  . Smoking status: Never Smoker   . Smokeless tobacco: Never Used  . Alcohol Use: No   OB History    Gravida Para Term Preterm AB TAB SAB Ectopic Multiple Living   0 1 0 1 0 0 4     Review of Systems  Constitutional: Positive for fever (subjective). Negative for chills, diaphoresis, appetite change, fatigue and unexpected weight change.  HENT: Positive for sore throat and  trouble swallowing. Negative for mouth sores.   Eyes: Negative for visual disturbance.  Respiratory: Negative for cough, chest tightness, shortness of breath and wheezing.   Cardiovascular: Negative for chest pain.  Gastrointestinal: Negative for nausea, vomiting, abdominal pain, diarrhea and constipation.  Endocrine: Negative for polydipsia, polyphagia and polyuria.  Genitourinary: Negative for dysuria, urgency, frequency and hematuria.  Musculoskeletal: Negative for back pain and neck stiffness.  Skin: Negative for rash.  Allergic/Immunologic: Negative for immunocompromised state.  Neurological: Negative for syncope, light-headedness and headaches.  Hematological: Does not bruise/bleed easily.  Psychiatric/Behavioral: Negative for sleep disturbance. The patient is not nervous/anxious.     Allergies  Review of patient's allergies indicates no known allergies.  Home Medications   Prior to Admission medications   Medication Sig Start Date End Date Taking? Authorizing Provider  aspirin-acetaminophen-caffeine (EXCEDRIN MIGRAINE) [phone] MG tablet Take by mouth every 6 (six) hours as needed for headache.    Historical Provider, MD  clindamycin (CLEOCIN) 150 MG capsule Take 3 capsules (450 mg total) by mouth 3 (three) times daily. 08/10/15   Aleane Wesenberg, PA-C  fluconazole (DIFLUCAN) 100 MG tablet Take 1 tablet (100 mg total) by mouth once. Repeat dose in 48-72 hour. 05/14/15   Rachelle A Denney, CNM  metroNIDAZOLE (FLAGYL) 500 MG tablet Take 1 tablet (500 mg total) by mouth 2 (two)  times daily. 05/14/15   Rachelle A Denney, CNM  Norethindrone-Ethinyl Estradiol-Fe Biphas (LO LOESTRIN FE) 1 MG-10 MCG / 10 MCG tablet Take 1 tablet by mouth daily. 05/14/15   Rachelle A Denney, CNM  oxyCODONE-acetaminophen (PERCOCET) 5-325 MG tablet Take 1-2 tablets by mouth every 4 (four) hours as needed. 08/10/15   Marriah Sanderlin, PA-C  PRENATAL VITAMINS PO Take by mouth.      Historical Provider,  MD   BP 108/66 mmHg  Pulse 110  Temp(Src) 99.3 F (37.4 C) (Oral)  Resp 18  Ht 5' (1.524 m)  Wt 58.968 kg  BMI 25.39 kg/m2  SpO2 95% Physical Exam  Constitutional: She appears well-developed and well-nourished. No distress.  HENT:  Head: Normocephalic and atraumatic.  Right Ear: Tympanic membrane, external ear and ear canal normal.  Left Ear: Tympanic membrane, external ear and ear canal normal.  Nose: Nose normal. No mucosal edema or rhinorrhea.  Mouth/Throat: Uvula is midline and mucous membranes are normal. Mucous membranes are not dry. No trismus in the jaw. No uvula swelling. Oropharyngeal exudate, posterior oropharyngeal edema and posterior oropharyngeal erythema present. No tonsillar abscesses.  Difficult exam due to trisums and gag reflux Right peritonsillar tissue edematous erythematous with uvula deviation   Eyes: Conjunctivae are normal.  Neck: Normal range of motion, full passive range of motion without pain and phonation normal. No tracheal tenderness, no spinous process tenderness and no muscular tenderness present. No rigidity. No erythema and normal range of motion present. No Brudzinski's sign and no Kernig's sign noted.  Range of motion without pain  No midline or paraspinal tenderness No stridor Handling secretions well No nuchal rigidity or meningeal signs Hot potato voice   Cardiovascular: Normal rate, regular rhythm and normal heart sounds.   Pulses:      Radial pulses are 2+ on the right side, and 2+ on the left side.  Pulmonary/Chest: Effort normal and breath sounds normal. No stridor. No respiratory distress. She has no decreased breath sounds. She has no wheezes.  Equal chest expansion, clear and equal breath sounds without focal wheezes, rhonchi or rales  Musculoskeletal: Normal range of motion.  Lymphadenopathy:       Head (right side): Submandibular and tonsillar adenopathy present. No submental, no preauricular, no posterior auricular and no occipital  adenopathy present.       Head (left side): No submental, no preauricular, no posterior auricular and no occipital adenopathy present.    She has cervical adenopathy (right sided).       Right cervical: No superficial cervical, no deep cervical and no posterior cervical adenopathy present.      Left cervical: No superficial cervical, no deep cervical and no posterior cervical adenopathy present.  Neurological: She is alert.  Alert and oriented Moves all extremities without ataxia  Skin: Skin is warm and dry. She is not diaphoretic.  Psychiatric: She has a normal mood and affect.  Nursing note and vitals reviewed.   ED Course  Procedures   DIAGNOSTIC STUDIES:  Oxygen Saturation is 99% on RA, normal by my interpretation.    COORDINATION OF CARE:  10:25 AM Discussed treatment plan with pt at bedside and pt agreed to plan.  Labs Review Labs Reviewed  RAPID STREP SCREEN (NOT AT [facility])  CULTURE, GROUP A STREP [facility])     MDM   Final diagnoses:  Peritonsillar abscess   MDM Number of Diagnoses or Management Options Pt with sore throat.  Presentation concerning for peritonsillar abscess. Pt is tolerating secretions and  has a patent airway.  Pt is a difficult exam due to right sided trismus and gag reflex.  Consult to ENT, Dr. Jearld Fenton.  12:15pm Patient valuated by Dr. Jearld Fenton. Patient was given the choice for IV clindamycin and medical management versus I&D. Patient she does medical management. She will be given IV antibiotics   2:22 PM Given clindamycin IV here in the emergency department. She reports she feels a little bit better. She continues to tolerate her secretions and has been able to drink a small amount of fluid without choking or gagging. Patient will be discharged home with clindamycin. She is to follow-up with ENT tomorrow for further evaluation. Specific return precautions discussed. Recommended PCP follow up. Pt appears safe for discharge.   I personally performed the  services described in this documentation, which was scribed in my presence. The recorded information has been reviewed and is accurate.    Dahlia Client Torien Ramroop, PA-C 08/10/15 1425

## 2015-08-11 ENCOUNTER — Encounter (HOSPITAL_COMMUNITY): Payer: Self-pay | Admitting: Emergency Medicine

## 2015-08-11 DIAGNOSIS — J36 Peritonsillar abscess: Secondary | ICD-10-CM | POA: Insufficient documentation

## 2015-08-11 DIAGNOSIS — R252 Cramp and spasm: Secondary | ICD-10-CM | POA: Diagnosis not present

## 2015-08-11 DIAGNOSIS — Z792 Long term (current) use of antibiotics: Secondary | ICD-10-CM | POA: Insufficient documentation

## 2015-08-11 DIAGNOSIS — Z8619 Personal history of other infectious and parasitic diseases: Secondary | ICD-10-CM | POA: Insufficient documentation

## 2015-08-11 LAB — COMPREHENSIVE METABOLIC PANEL
ALBUMIN: 4 g/dL (ref 3.5–5.0)
ALK PHOS: 61 U/L (ref 38–126)
ALT: 11 U/L — ABNORMAL LOW (ref 14–54)
ANION GAP: 12 (ref 5–15)
AST: 15 U/L (ref 15–41)
BUN: 13 mg/dL (ref 6–20)
CHLORIDE: 107 mmol/L (ref 101–111)
CO2: 23 mmol/L (ref 22–32)
Calcium: 9 mg/dL (ref 8.9–10.3)
Creatinine, Ser: 0.69 mg/dL (ref 0.44–1.00)
GFR calc Af Amer: >60 mL/min (ref >60)
GFR calc non Af Amer: >60 mL/min (ref >60)
GLUCOSE: 84 mg/dL (ref 65–99)
POTASSIUM: 3.3 mmol/L — AB (ref 3.5–5.1)
SODIUM: 142 mmol/L (ref 135–145)
Total Bilirubin: 0.6 mg/dL (ref 0.3–1.2)
Total Protein: 7.5 g/dL (ref 6.5–8.1)

## 2015-08-11 LAB — CBC
HEMATOCRIT: 33.7 % — AB (ref 36.0–46.0)
HEMOGLOBIN: 10.4 g/dL — AB (ref 12.0–15.0)
MCH: 24.4 pg — AB (ref 26.0–34.0)
MCHC: 30.9 g/dL (ref 30.0–36.0)
MCV: 79.1 fL (ref 78.0–100.0)
Platelets: 326 10*3/uL (ref 150–400)
RBC: 4.26 MIL/uL (ref 3.87–5.11)
RDW: 14.8 % (ref 11.5–15.5)
WBC: 10.3 10*3/uL (ref 4.0–10.5)

## 2015-08-11 NOTE — ED Notes (Addendum)
Pt was treated here yesterday for a peritonsillar abscess and didn't want to have it drained so was treated with IV antibiotics and told to come back for worsening of symptoms. Pt states it is difficult to swallow and has a lot of saliva in mouth that she is having trouble swallowing. Pt will only mumble because she states it hurts to talk.

## 2015-08-12 ENCOUNTER — Emergency Department (HOSPITAL_COMMUNITY)
Admission: EM | Admit: 2015-08-12 | Discharge: 2015-08-12 | Disposition: A | Discharge status: Home / Self Care | Payer: Medicaid Other | Attending: Emergency Medicine | Admitting: Emergency Medicine

## 2015-08-12 DIAGNOSIS — J36 Peritonsillar abscess: Secondary | ICD-10-CM | POA: Insufficient documentation

## 2015-08-12 LAB — CULTURE, GROUP A STREP (THRC)

## 2015-08-12 MED ORDER — MORPHINE SULFATE (PF) 4 MG/ML IV SOLN
4.0000 mg | Freq: Once | INTRAVENOUS | Status: AC
  Administered 2015-08-12: 4 mg via INTRAVENOUS
  Filled 2015-08-12: qty 1

## 2015-08-12 MED ORDER — DEXAMETHASONE SODIUM PHOSPHATE 10 MG/ML IJ SOLN
10.0000 mg | Freq: Once | INTRAMUSCULAR | Status: AC
  Administered 2015-08-12: 10 mg via INTRAVENOUS
  Filled 2015-08-12: qty 1

## 2015-08-12 MED ORDER — ONDANSETRON HCL 4 MG/2ML IJ SOLN
4.0000 mg | Freq: Once | INTRAMUSCULAR | Status: AC
  Administered 2015-08-12: 4 mg via INTRAVENOUS
  Filled 2015-08-12: qty 2

## 2015-08-12 MED ORDER — SODIUM CHLORIDE 0.9 % IV BOLUS (SEPSIS)
1000.0000 mL | Freq: Once | INTRAVENOUS | Status: AC
  Administered 2015-08-12: 1000 mL via INTRAVENOUS

## 2015-08-12 MED ORDER — CLINDAMYCIN PHOSPHATE 600 MG/50ML IV SOLN
600.0000 mg | Freq: Once | INTRAVENOUS | Status: AC
  Administered 2015-08-12: 600 mg via INTRAVENOUS
  Filled 2015-08-12: qty 50

## 2015-08-12 NOTE — ED Provider Notes (Signed)
CSN: [id number]     Arrival date & time 08/11/15  2155 History  By signing my name below, I, Alison Stone, attest that this documentation has been prepared under the direction and in the presence of [name], MD. Electronically Signed: Bethel Stone, ED Scribe. 08/12/2015.3:43 AM   Chief Complaint  Patient presents with  . Abscess    The history is provided by the patient. No language interpreter was used.   Alison Stone is a 30 y.o. female who presents to the Emergency Department complaining of increasing, 10/10 in severity, constant pain at the site of a right-sided peritonsillar abscess with onset 4 days ago. Pt was seen in the ED yesterday for the abscess but declined to have it drained. She has been using the pain medication and abx that she was prescribed at that time without any relief. The pain is worse with swallowing. Associated symptoms include trismus. Pt denies fever. They called ENT but the office was closed.    I have reviewed the patient's chart. Abscess was diagnosed clinically and patient was examined by Dr. Jearld Fenton.  At initial assessment, patient was documented to have some trismus but was otherwise nontoxic. She was supposed to follow-up on Monday in ENT office but did not.  Past Medical History  Diagnosis Date  . 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 Medical history non-contributory    Past Surgical History  Procedure Laterality Date  . Throat surgery      cyst blocking airway   Family History  Problem Relation Age of Onset  . Asthma Maternal Aunt   . Kidney disease Maternal Aunt   . Migraines Maternal Aunt   . Nephrolithiasis Paternal Aunt   . Migraines Paternal Aunt   . Stroke Maternal Grandfather   . Hypertension Maternal Grandfather   . Heart failure Maternal Grandfather    Social History  Substance Use Topics  . Smoking status: Never Smoker   . Smokeless tobacco: Never Used  . Alcohol Use: No    OB History    Gravida Para Term Preterm AB TAB SAB Ectopic Multiple Living   0 1 0 1 0 0 4     Review of Systems  Constitutional: Negative for fever.  HENT: Positive for sore throat and trouble swallowing.        Trismus  Respiratory: Negative for shortness of breath.   Cardiovascular: Negative for chest pain.  All other systems reviewed and are negative.  Allergies  Review of patient's allergies indicates no known allergies.  Home Medications   Prior to Admission medications   Medication Sig Start Date End Date Taking? Authorizing Provider  clindamycin (CLEOCIN) 150 MG capsule Take 3 capsules (450 mg total) by mouth 3 (three) times daily. 08/10/15  Yes Hannah Muthersbaugh, PA-C  oxyCODONE-acetaminophen (PERCOCET) 5-325 MG tablet Take 1-2 tablets by mouth every 4 (four) hours as needed. 08/10/15  Yes Hannah Muthersbaugh, PA-C  fluconazole (DIFLUCAN) 100 MG tablet Take 1 tablet (100 mg total) by mouth once. Repeat dose in 48-72 hour. Patient not taking: Reported on 08/12/2015 05/14/15   Rachelle A Denney, CNM  metroNIDAZOLE (FLAGYL) 500 MG tablet Take 1 tablet (500 mg total) by mouth 2 (two) times daily. Patient not taking: Reported on 08/12/2015 05/14/15   Roe Coombs, CNM  Norethindrone-Ethinyl Estradiol-Fe Biphas (LO LOESTRIN FE) 1 MG-10 MCG / 10 MCG tablet Take 1 tablet by mouth daily. Patient not taking:  Reported on 08/12/2015 05/14/15   Rachelle A Denney, CNM   BP 121/81 mmHg  Pulse 112  Temp(Src) 99.2 F (37.3 C) (Oral)  Resp 16  Ht  (1.651 m)  Wt 130 lb (58.968 kg)  BMI 21.63 kg/m2  SpO2 100%  LMP 07/28/2015 Physical Exam  Constitutional: She is oriented to person, place, and time. No distress.  Nontoxic-appearing, mumbling  HENT:  Head: Normocephalic and atraumatic.  Trismus noted, posterior or pharyngeal exam limited secondary to trismus and patient discomfort, unable to visualize posterior oropharynx, handling her secretions  Neck: Normal range  of motion. Neck supple.  Cardiovascular: Normal rate, regular rhythm and normal heart sounds.   No murmur heard. Pulmonary/Chest: Effort normal and breath sounds normal. No stridor. No respiratory distress. She has no wheezes.  Abdominal: Soft. Bowel sounds are normal.  Lymphadenopathy:    She has cervical adenopathy.  Neurological: She is alert and oriented to person, place, and time.  Skin: Skin is warm and dry.  Psychiatric: She has a normal mood and affect.  Nursing note and vitals reviewed.   ED Course  Procedures (including critical care time) DIAGNOSTIC STUDIES: Oxygen Saturation is 100% on RA,  normal by my interpretation.    COORDINATION OF CARE: 3:26 AM Discussed treatment plan which includes lab work and ENT consult with pt at bedside and pt agreed to plan.  3:41 AM-Consult complete with Dr. Jenne Pane (ENT). Patient case explained and discussed. Call ended at 3:42 AM Labs Review Labs Reviewed  COMPREHENSIVE METABOLIC PANEL - Abnormal; Notable for the following:    Potassium 3.3 (*)    ALT 11 (*)    All other components within normal limits  CBC - Abnormal; Notable for the following:    Hemoglobin 10.4 (*)    HCT 33.7 (*)    MCH 24.4 (*)    All other components within normal limits    Imaging Review No results found.    EKG Interpretation None      MDM   Final diagnoses:  Peritonsillar abscess   Patient presents with worsening pain and swelling from a presumed peritonsillar abscess. She has failed medical management. She did not follow-up with ENT. She is nontoxic-appearing. I'm unable to visualize the tonsil or the oropharynx secondary to trismus and patient discomfort. Patient was given fluids, Decadron, and an additional dose of clindamycin. Discussed with Dr. Jenne Pane. He would like for the patient to be seen in outpatient clinic first thing in the morning for drainage. Patient was observed for a period of time in the emergency room without ill effects. She  remained nontoxic. She continues to tolerate her secretions well. Will discharge her to ENT clinic. She was given strict return precautions.  After history, exam, and medical workup I feel the patient has been appropriately medically screened and is safe for discharge home. Pertinent diagnoses were discussed with the patient. Patient was given return precautions.  I personally performed the services described in this documentation, which was scribed in my presence. The recorded information has been reviewed and is accurate.    [name], MD 08/12/15 [phone]

## 2015-08-12 NOTE — Discharge Instructions (Signed)
You were seen today for worsening pain and swelling from what appeared to today's ago to be a peritonsillar abscess. Your exam is limited at this time. I discussed with Dr. Jenne Pane on call. You were given additional antibiotics and steroids. He would like to evaluate you in the office first thing this morning for possible drainage.  Peritonsillar Abscess A peritonsillar abscess is a collection of yellowish-white fluid (pus) in the back of the throat behind the tonsils. It usually occurs when an infection of the throat or tonsils (tonsillitis) spreads into the tissues around the tonsils. CAUSES The infection that leads to a peritonsillar abscess is usually caused by streptococcal bacteria.  SIGNS AND SYMPTOMS  Sore throat, often with pain on just one side.  Swelling and tenderness of the glands (lymph nodes) in the neck.  Difficulty swallowing.  Difficulty opening your mouth.  Fever.  Chills.  Drooling because of difficulty swallowing saliva.  Headache.  Changes in your voice.  Bad breath. DIAGNOSIS Your health care provider will take your medical history and do a physical exam. Imaging tests may be done, such as an ultrasound or CT scan. A sample of pus may be removed from the abscess using a needle (needle aspiration) or by swabbing the back of your throat. This sample will be sent to a lab for testing. TREATMENT Treatment usually involves draining the pus from the abscess. This may be done through needle aspiration or by making an incision in the abscess. You will also likely need to take antibiotic medicine. HOME CARE INSTRUCTIONS  Rest as much as possible and get plenty of sleep.  Take medicines only as directed by your health care provider.  If you were prescribed an antibiotic medicine, finish it all even if you start to feel better.  If your abscess was drained by your health care provider, gargle with a mixture of salt and warm water:  Mix 1 tsp of salt in 8 oz of warm  water.  Gargle with this mixture four times per day or as needed for comfort.  Do not swallow this mixture.  Drink plenty of fluids.  While your throat is sore, eat soft or liquid foods, such as frozen ice pops and ice cream.  Keep all follow-up visits as directed by your health care provider. This is important. SEEK MEDICAL CARE IF:  You have increased pain, swelling, redness, or drainage in your throat.  You develop a headache, a lack of energy (lethargy), or generalized feelings of illness.  You have a fever.  You feel dizzy.  You have difficulty swallowing or eating.  You show signs of becoming dehydrated, such as:  Light-headedness when standing.  Decreased urine output.  A fast heart rate.  Dry mouth. SEEK IMMEDIATE MEDICAL CARE IF:   You have difficulty talking or breathing, or you find it easier to breathe when you lean forward.  You are coughing up blood or vomiting blood.  You have severe throat pain that is not helped by medicines.  You start to drool.   This information is not intended to replace advice given to you by your health care provider. Make sure you discuss any questions you have with your health care provider.   Document Released: 06/14/2005 Document Revised: 07/05/2014 Document Reviewed: 01/28/2014 Elsevier Interactive Patient Education [employer]

## 2015-08-12 NOTE — ED Notes (Signed)
Pt verbalizes understanding of instructions. 

## 2015-10-14 IMAGING — US US OB COMP +14 WK
2 series · 12 of 28 positions shown · non-contrast
Comparison: none

[Series 1: us ob detail +14 wk · 10 of 63 slices shown (1 of 2)]
[im 3/63]
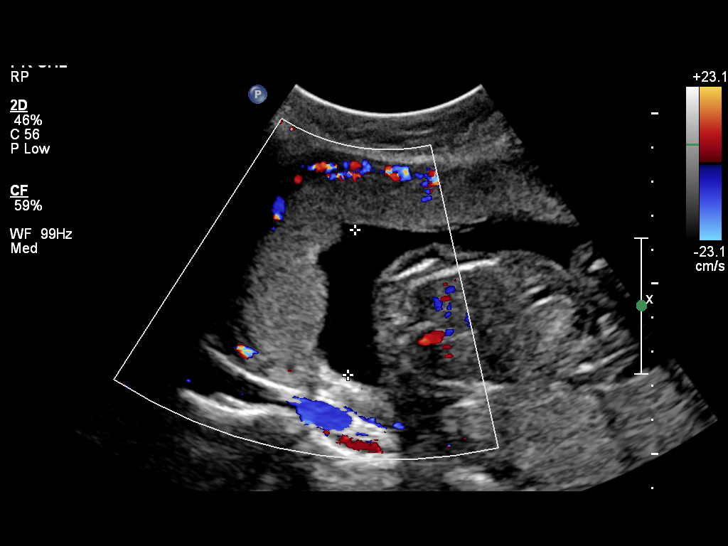
[im 9/63]
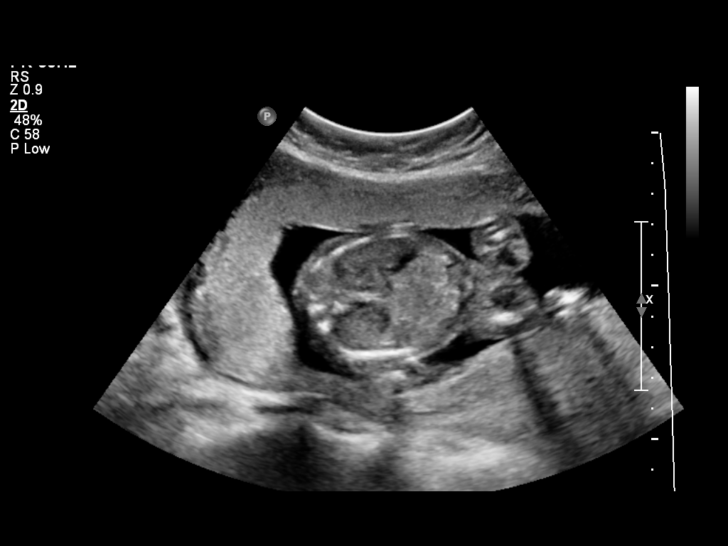
[im 14/63]
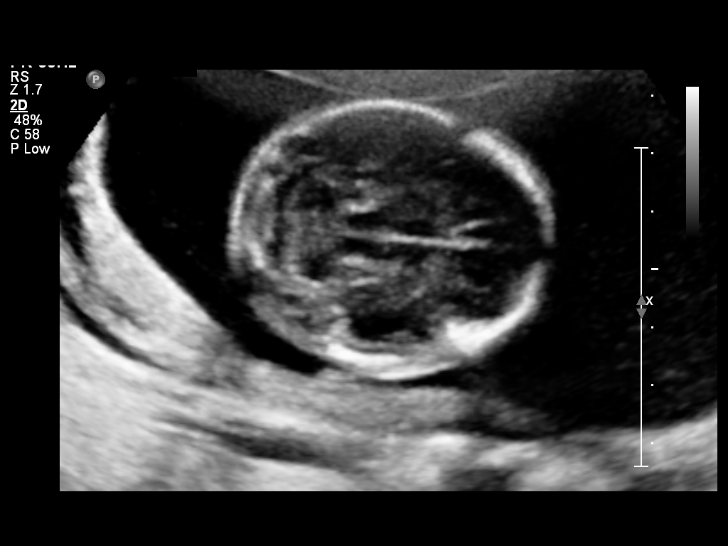
[im 22/63]
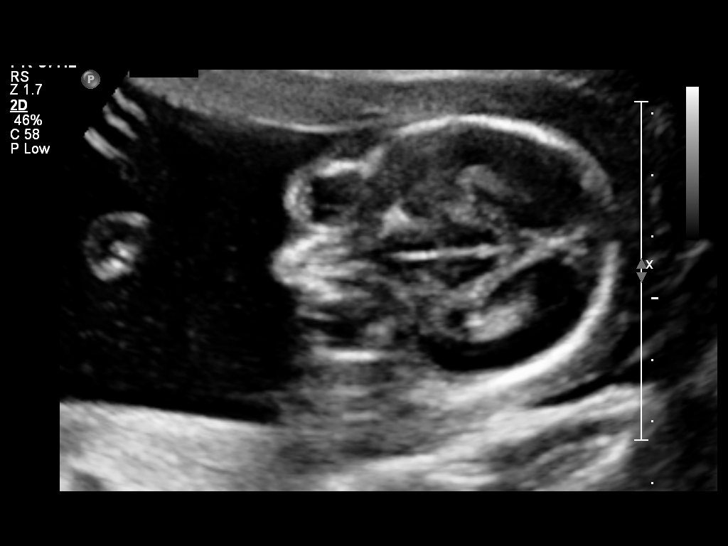
[im 27/63]
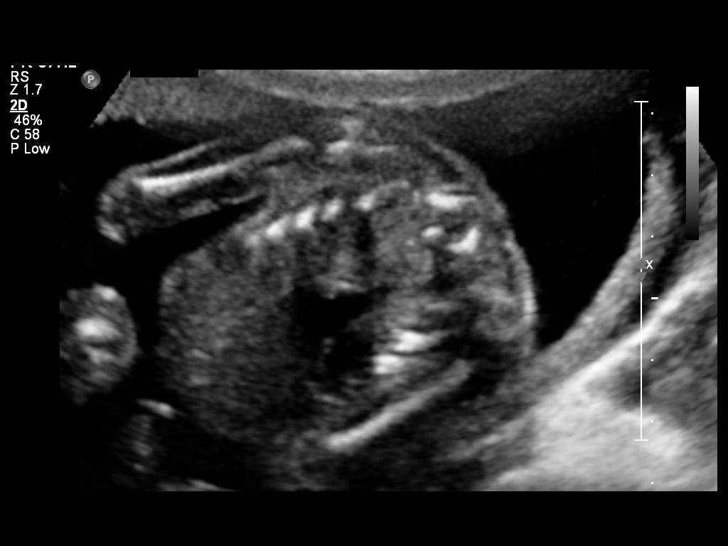
[im 33/63]
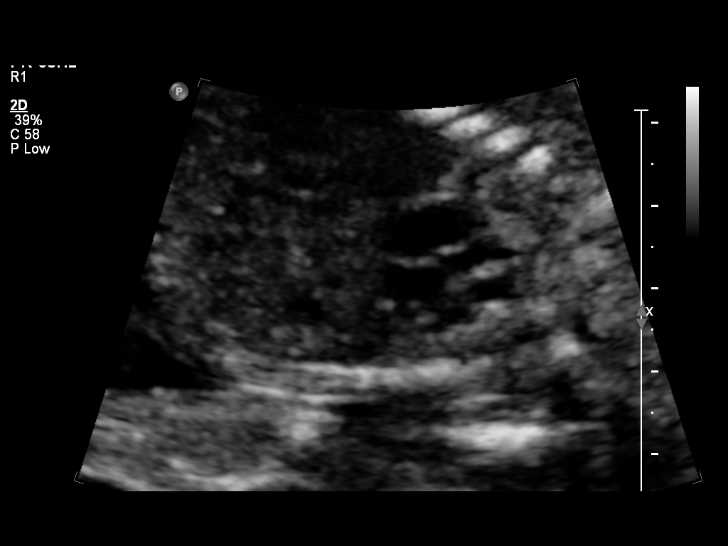
[im 41/63]
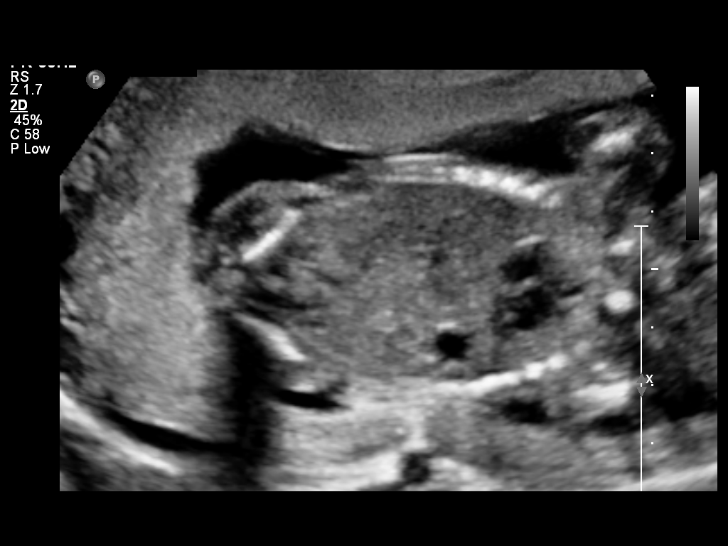
[im 46/63]
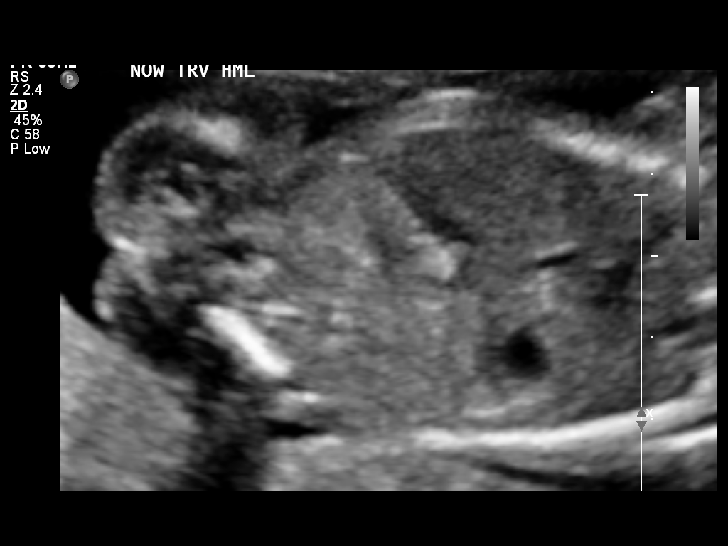
[im 52/63]
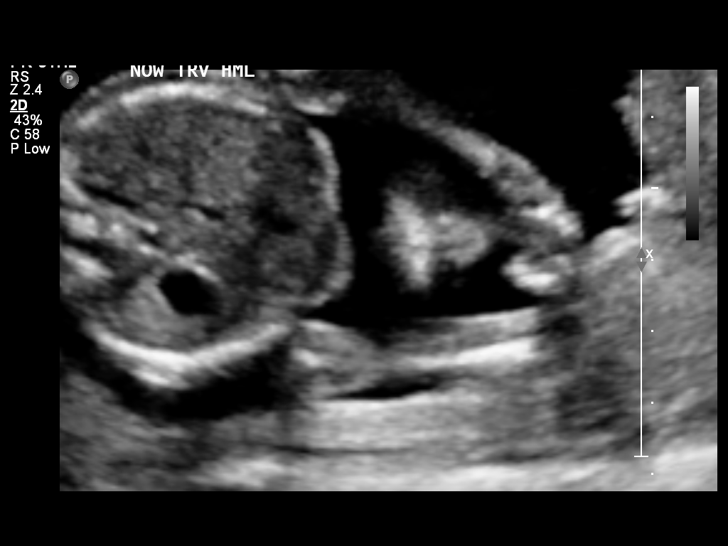
[im 60/63]
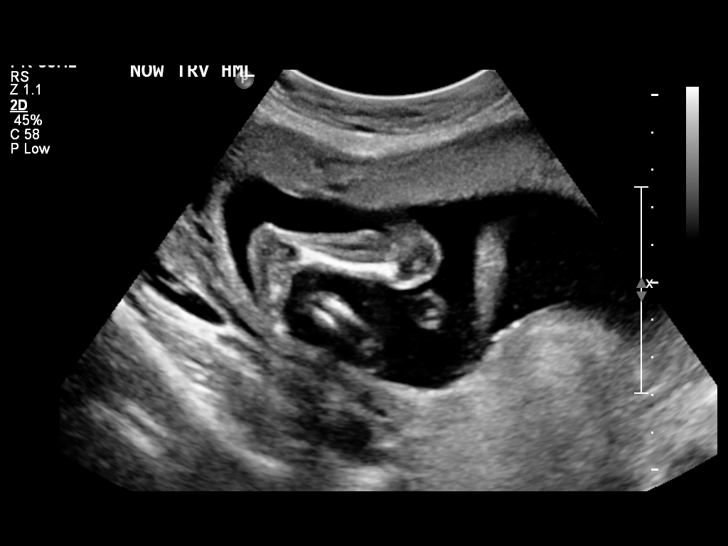

[Series 1: us ob detail +14 wk · 2 of 12 slices shown (2 of 2)]
[im 1/12]
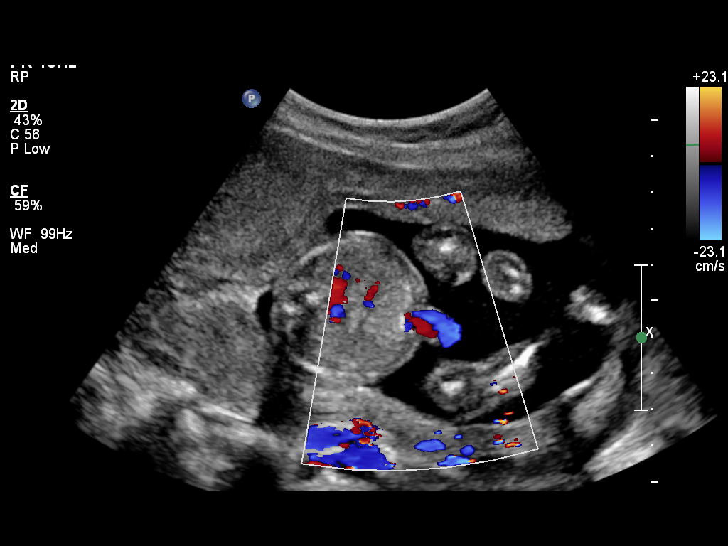
[im 8/12]
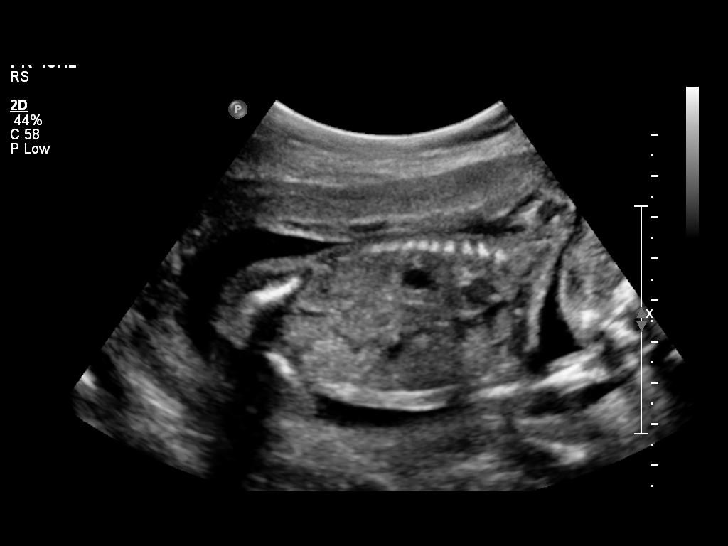

[12 of 28 positions shown; findings below may reference images not displayed]

OBSTETRICS REPORT
                      (Signed Final 06/01/2013 [DATE])

Service(s) Provided

 US OB COMP + 14 WK                                    76805.1
Indications

 Basic anatomic survey
Fetal Evaluation

 Num Of Fetuses:    1
 Fetal Heart Rate:  152                          bpm
 Cardiac Activity:  Observed
 Presentation:      Variable
 Placenta:          Anterior, above cervical os
 P. Cord            Visualized, central
 Insertion:

 Amniotic Fluid
 AFI FV:      Subjectively within normal limits
                                             Larg Pckt:    4.24  cm
Biometry

 BPD:     43.3  mm     G. Age:  19w 1d                CI:         74.2   70 - 86
                                                      FL/HC:      17.5   16.8 -

 HC:     159.6  mm     G. Age:  18w 6d         5  %   HC/AC:      1.11   1.09 -

 AC:     144.1  mm     G. Age:  19w 5d       36   %   FL/BPD:
 FL:        28  mm     G. Age:  18w 4d         6  %   FL/AC:      19.4   20 - 24
 CER:     19.8  mm     G. Age:  18w 6d       23   %
 NFT:     4.05  mm

 Est. FW:     277   gm   0 lb 10 oz      33  %
Gestational Age

 LMP:           20w 0d        Date:  01/12/13                 EDD:   10/19/13
 U/S Today:     19w 0d                                        EDD:   10/26/13
 Best:          20w 0d     Det. By:  LMP  (01/12/13)          EDD:   10/19/13
Anatomy

 Cranium:          Appears normal         Aortic Arch:      Not well visualized
 Fetal Cavum:      Appears normal         Ductal Arch:      Not well visualized
 Ventricles:       Appears normal         Diaphragm:        Appears normal
 Choroid Plexus:   Appears normal         Stomach:          Appears normal, left
                                                            sided
 Cerebellum:       Appears normal         Abdomen:          Appears normal
 Posterior Fossa:  Appears normal         Abdominal Wall:   Appears nml (cord
                                                            insert, abd wall)
 Nuchal Fold:      Appears normal         Cord Vessels:     Appears normal (3
                                                            vessel cord)
 Face:             Orbits appear          Kidneys:          Appear normal
                   normal
 Lips:             Appears normal         Bladder:          Appears normal
 Heart:            Echogenic focus        Spine:            Appears normal
                   in LV
 RVOT:             Not well visualized    Lower             Appears normal
                                          Extremities:
 LVOT:             Not well visualized    Upper             Appears normal
                                          Extremities:

 Other:  Fetus appears to be a male. Heels visualized. Nasal bone visualized.
         Technically difficult due to fetal position.
Targeted Anatomy

 Fetal Central Nervous System
 Lat. Ventricles:  6.5                    Cisterna Magna:
Cervix Uterus Adnexa

 Cervical Length:    3.2       cm

 Cervix:       Normal appearance by transabdominal scan.
Comments

 An echogenic focus was seen in the left cardiac ventricle.
 This is felt to represent a calcified papillary muscle, and is not
 associated with structural or functional cardiac abnormalities.
 Although an echogenic cardiac focus may be associated with
 an increased risk of Down syndrome, this risk is felt to be
 minimal, especially when it is seen as an isolated finding.
Impression

 Single IUP at 20 0/7 weeks
 Echogenic intracardiac focus noted (see comments)
 Normal anatomic fetal survey; however, limited views of the
 fetal heart obtained due to fetal position
 No other markers associated with aneuploidy noted
 Normal amniotic fluid volume
Recommendations

 Recommend follow-up ultrasound examination in 4-6 weeks
 to reevaluate the fetal heart.

## 2015-11-19 ENCOUNTER — Other Ambulatory Visit: Payer: Self-pay | Admitting: *Deleted

## 2015-11-19 DIAGNOSIS — B3731 Acute candidiasis of vulva and vagina: Secondary | ICD-10-CM

## 2015-11-19 DIAGNOSIS — N760 Acute vaginitis: Secondary | ICD-10-CM

## 2015-11-19 DIAGNOSIS — B373 Candidiasis of vulva and vagina: Secondary | ICD-10-CM

## 2015-11-19 MED ORDER — FLUCONAZOLE 100 MG PO TABS: 100.0000 mg | ORAL_TABLET | Freq: Once | ORAL | Status: DC

## 2015-11-19 NOTE — Progress Notes (Signed)
Pt called to office with symptoms of yeast infection. Pt ask for refill on Diflucan. Per protocol, refill sent as previously prescribed.

## 2015-12-31 ENCOUNTER — Encounter: Payer: Self-pay | Admitting: Certified Nurse Midwife

## 2015-12-31 ENCOUNTER — Ambulatory Visit (INDEPENDENT_AMBULATORY_CARE_PROVIDER_SITE_OTHER): Payer: Medicaid Other | Admitting: Certified Nurse Midwife

## 2015-12-31 VITALS — BP 106/75 | HR 85 | Wt 123.0 lb

## 2015-12-31 DIAGNOSIS — Z30018 Encounter for initial prescription of other contraceptives: Secondary | ICD-10-CM | POA: Diagnosis not present

## 2015-12-31 DIAGNOSIS — A499 Bacterial infection, unspecified: Secondary | ICD-10-CM

## 2015-12-31 DIAGNOSIS — N760 Acute vaginitis: Secondary | ICD-10-CM | POA: Diagnosis not present

## 2015-12-31 DIAGNOSIS — B9689 Other specified bacterial agents as the cause of diseases classified elsewhere: Secondary | ICD-10-CM

## 2015-12-31 MED ORDER — FLUCONAZOLE 200 MG PO TABS: 200.0000 mg | ORAL_TABLET | Freq: Once | ORAL | Status: DC

## 2015-12-31 MED ORDER — METRONIDAZOLE 500 MG PO TABS: 500.0000 mg | ORAL_TABLET | Freq: Two times a day (BID) | ORAL | Status: DC

## 2015-12-31 MED ORDER — NORELGESTROMIN-ETH ESTRADIOL 150-35 MCG/24HR TD PTWK: 1.0000 | MEDICATED_PATCH | TRANSDERMAL | Status: DC

## 2015-12-31 NOTE — Progress Notes (Signed)
Patient ID: Alison Stone, female   DOB: 02/05/1986, 30 y.o.   MRN: [id number]    Chief Complaint  Patient presents with  . Vaginitis    yeast infection, was treated still having symptoms    HPI Alison Stone is a 30 y.o. female.  Here for vaginal odor that has been going on for several weeks, denies any vaginal itching or urinary symptoms.  Denies douching, change in soaps, etc.  States that she is also sexually active, not currently using OCPs due to HA she was getting with LOLO.  Discussed contraception options: decided for ortho evra patches.    HPI  Past Medical History  Diagnosis Date  . 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 Medical history non-contributory     Past Surgical History  Procedure Laterality Date  . Throat surgery      cyst blocking airway    Family History  Problem Relation Age of Onset  . Asthma Maternal Aunt   . Kidney disease Maternal Aunt   . Migraines Maternal Aunt   . Nephrolithiasis Paternal Aunt   . Migraines Paternal Aunt   . Stroke Maternal Grandfather   . Hypertension Maternal Grandfather   . Heart failure Maternal Grandfather     Social History Social History  Substance Use Topics  . Smoking status: Never Smoker   . Smokeless tobacco: Never Used  . Alcohol Use: No    No Known Allergies  Current Outpatient Prescriptions  Medication Sig Dispense Refill  . fluconazole (DIFLUCAN) 200 MG tablet Take 1 tablet (200 mg total) by mouth once. Repeat dose in 48-72 hours. 3 tablet 0  . metroNIDAZOLE (FLAGYL) 500 MG tablet Take 1 tablet (500 mg total) by mouth 2 (two) times daily. 14 tablet 0  . norelgestromin-ethinyl estradiol (ORTHO EVRA) 150-35 MCG/24HR transdermal patch Place 1 patch onto the skin once a week. 3 patch 12  . Norethindrone-Ethinyl Estradiol-Fe Biphas (LO LOESTRIN FE) 1 MG-10 MCG / 10 MCG tablet Take 1 tablet by mouth daily. (Patient not taking: Reported on 08/12/2015) 1 Package 11  .  [DISCONTINUED] Norethindrone Acetate-Ethinyl Estrad-FE (LOESTRIN 24 FE) 1-20 MG-MCG(24) tablet Take 1 tablet by mouth daily. (Patient not taking: Reported on 05/14/2015) 1 Package 11   No current facility-administered medications for this visit.    Review of Systems Review of Systems Constitutional: negative for fatigue and weight loss Respiratory: negative for cough and wheezing Cardiovascular: negative for chest pain, fatigue and palpitations Gastrointestinal: negative for abdominal pain and change in bowel habits Genitourinary: + vaginal odor Integument/breast: negative for nipple discharge Musculoskeletal:negative for myalgias Neurological: negative for gait problems and tremors Behavioral/Psych: negative for abusive relationship, depression Endocrine: negative for temperature intolerance     Blood pressure 106/75, pulse 85, weight 123 lb (55.792 kg), last menstrual period 12/13/2015, unknown if currently breastfeeding.  Physical Exam Physical Exam General:   alert  Skin:   no rash or abnormalities  Lungs:   clear to auscultation bilaterally  Heart:   regular rate and rhythm, S1, S2 normal, no murmur, click, rub or gallop  Breasts:   deferred  Abdomen:  normal findings: no organomegaly, soft, non-tender and no hernia  Pelvis:  External genitalia: normal general appearance Urinary system: urethral meatus normal and bladder without fullness, nontender Vaginal: normal without tenderness, induration or masses Cervix: normal appearance Adnexa: normal bimanual exam Uterus: anteverted and non-tender, normal size    50% of 15 min visit spent  on counseling and coordination of care.   Data Reviewed Previous medical hx, meds, labs  Assessment     BV Contraception management     Plan    No orders of the defined types were placed in this encounter.   Meds ordered this encounter  Medications  . metroNIDAZOLE (FLAGYL) 500 MG tablet    Sig: Take 1 tablet (500 mg total) by  mouth 2 (two) times daily.    Dispense:  14 tablet    Refill:  0  . fluconazole (DIFLUCAN) 200 MG tablet    Sig: Take 1 tablet (200 mg total) by mouth once. Repeat dose in 48-72 hours.    Dispense:  3 tablet    Refill:  0  . norelgestromin-ethinyl estradiol (ORTHO EVRA) 150-35 MCG/24HR transdermal patch    Sig: Place 1 patch onto the skin once a week.    Dispense:  3 patch    Refill:  12    Follow up as needed.

## 2016-01-02 ENCOUNTER — Other Ambulatory Visit: Payer: Self-pay | Admitting: Certified Nurse Midwife

## 2016-01-02 LAB — NUSWAB VG+, CANDIDA 6SP
Atopobium vaginae: HIGH {score} — AB
BVAB 2: HIGH {score} — AB
Candida albicans, NAA: NEGATIVE
Candida glabrata, NAA: NEGATIVE
Candida krusei, NAA: NEGATIVE
Candida lusitaniae, NAA: NEGATIVE
Candida parapsilosis, NAA: NEGATIVE
Candida tropicalis, NAA: NEGATIVE
Chlamydia trachomatis, NAA: NEGATIVE
Megasphaera 1: HIGH {score} — AB
Neisseria gonorrhoeae, NAA: NEGATIVE
Trich vag by NAA: NEGATIVE

## 2016-05-14 ENCOUNTER — Encounter: Payer: Self-pay | Admitting: Certified Nurse Midwife

## 2016-05-14 ENCOUNTER — Encounter: Payer: Self-pay | Admitting: *Deleted

## 2016-05-14 ENCOUNTER — Other Ambulatory Visit (HOSPITAL_COMMUNITY)
Admission: RE | Admit: 2016-05-14 | Discharge: 2016-05-14 | Disposition: A | Discharge status: Home / Self Care | Payer: Medicaid Other | Source: Ambulatory Visit | Attending: Obstetrics | Admitting: Obstetrics

## 2016-05-14 ENCOUNTER — Ambulatory Visit (INDEPENDENT_AMBULATORY_CARE_PROVIDER_SITE_OTHER): Payer: Medicaid Other | Admitting: Certified Nurse Midwife

## 2016-05-14 VITALS — BP 120/85 | HR 98 | Wt 121.0 lb

## 2016-05-14 DIAGNOSIS — N888 Other specified noninflammatory disorders of cervix uteri: Secondary | ICD-10-CM

## 2016-05-14 DIAGNOSIS — Z01411 Encounter for gynecological examination (general) (routine) with abnormal findings: Secondary | ICD-10-CM | POA: Insufficient documentation

## 2016-05-14 DIAGNOSIS — N760 Acute vaginitis: Secondary | ICD-10-CM

## 2016-05-14 DIAGNOSIS — Z01419 Encounter for gynecological examination (general) (routine) without abnormal findings: Secondary | ICD-10-CM | POA: Diagnosis not present

## 2016-05-14 DIAGNOSIS — Z30018 Encounter for initial prescription of other contraceptives: Secondary | ICD-10-CM | POA: Diagnosis not present

## 2016-05-14 DIAGNOSIS — N761 Subacute and chronic vaginitis: Secondary | ICD-10-CM

## 2016-05-14 DIAGNOSIS — B9689 Other specified bacterial agents as the cause of diseases classified elsewhere: Secondary | ICD-10-CM

## 2016-05-14 MED ORDER — METRONIDAZOLE 0.75 % VA GEL: 1.0000 | VAGINAL | 4 refills | Status: DC

## 2016-05-14 MED ORDER — FLUCONAZOLE 200 MG PO TABS: 200.0000 mg | ORAL_TABLET | Freq: Once | ORAL | 4 refills | Status: AC

## 2016-05-14 MED ORDER — NORELGESTROMIN-ETH ESTRADIOL 150-35 MCG/24HR TD PTWK: 1.0000 | MEDICATED_PATCH | TRANSDERMAL | 12 refills | Status: DC

## 2016-05-14 MED ORDER — METRONIDAZOLE 500 MG PO TABS: 2000.0000 mg | ORAL_TABLET | Freq: Once | ORAL | 0 refills | Status: AC

## 2016-05-14 MED ORDER — TERCONAZOLE 0.8 % VA CREA: 1.0000 | TOPICAL_CREAM | Freq: Every day | VAGINAL | 4 refills | Status: DC

## 2016-05-14 NOTE — Progress Notes (Signed)
Subjective:        Alison Stone is a 30 y.o. female here for a routine exam.  Current complaints: chronic BV.  Denies duching.  Has 2leeding, clots or cramping.  Is not sure about birth control at this point.    Declines blood STD testing.  Start ed using ortho evra patches the end of July.    Personal health questionnaire:  Is patient Ashkenazi Jewish, have a family history of breast and/or ovarian cancer: no Is there a family history of uterine cancer diagnosed at age < 54, gastrointestinal cancer, urinary tract cancer, family member who is a Personnel officer syndrome-associated carrier: no Is the patient overweight and hypertensive, family history of diabetes, personal history of gestational diabetes, preeclampsia or PCOS: no Is patient over 45, have PCOS,  family history of premature CHD under age 109, diabetes, smoke, have hypertension or peripheral artery disease:  Yes, MGF: MI, CVA, DM, HTN on maternal side At any time, has a partner hit, kicked or otherwise hurt or frightened you?: no Over the past 2 weeks, have you felt down, depressed or hopeless?: no Over the past 2 weeks, have you felt little interest or pleasure in doing things?:no   Gynecologic History Patient's last menstrual period was 04/13/2016 (exact date). Contraception: Ortho-Evra patches weekly Last Pap: 05/14/15. Results were: normal Last mammogram: n/a.   Obstetric History OB History  Gravida Para Term Preterm AB Living  5 4 4  0 1 4  SAB TAB Ectopic Multiple Live Births  1 0 0 0 4    # Outcome Date GA Lbr Len/2nd Weight Sex Delivery Anes PTL Lv  5 Term 10/24/13 [redacted]w[redacted]d 04:24 / 00:20 8 lb 2.9 oz (3.71 kg) M Vag-Spont None  LIV  4 Term 08/14/11 [redacted]w[redacted]d 03:55 / 02:00 8 lb 2.5 oz (3.7 kg) F Vag-Spont None  LIV     Birth Comments: none  3 Term 2009 [redacted]w[redacted]d 24:00 7 lb (3.175 kg) F Vag-Spont None  LIV  2 SAB 2006          1 Term 2005 [redacted]w[redacted]d 24:00 8 lb (3.629 kg) F Vag-Spont None  LIV     Birth Comments: System  Generated. Please review and update pregnancy details.      Past Medical History:  Diagnosis Date  . 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 Medical history non-contributory     Past Surgical History:  Procedure Laterality Date  . THROAT SURGERY     cyst blocking airway     Current Outpatient Prescriptions:  .  norelgestromin-ethinyl estradiol (ORTHO EVRA) 150-35 MCG/24HR transdermal patch, Place 1 patch onto the skin once a week., Disp: 3 patch, Rfl: 12 .  Norethindrone-Ethinyl Estradiol-Fe Biphas (LO LOESTRIN FE) 1 MG-10 MCG / 10 MCG tablet, Take 1 tablet by mouth daily., Disp: 1 Package, Rfl: 11 .  fluconazole (DIFLUCAN) 200 MG tablet, Take 1 tablet (200 mg total) by mouth once. Repeat dose in 48-72 hours., Disp: 3 tablet, Rfl: 4 .  metroNIDAZOLE (FLAGYL) 500 MG tablet, Take 4 tablets (2,000 mg total) by mouth once., Disp: 4 tablet, Rfl: 0 .  [START ON 05/17/2016] metroNIDAZOLE (METROGEL VAGINAL) 0.75 % vaginal gel, Place 1 Applicatorful vaginally 2 (two) times a week. For 4-6 months., Disp: 70 g, Rfl: 4 .  terconazole (TERAZOL 3) 0.8 % vaginal cream, Place 1 applicator vaginally at bedtime., Disp: 20 g, Rfl: 4 No Known Allergies  Social History  Substance Use Topics  .  Smoking status: Never Smoker  . Smokeless tobacco: Never Used  . Alcohol use No    Family History  Problem Relation Age of Onset  . Asthma Maternal Aunt   . Kidney disease Maternal Aunt   . Migraines Maternal Aunt   . Nephrolithiasis Paternal Aunt   . Migraines Paternal Aunt   . Stroke Maternal Grandfather   . Hypertension Maternal Grandfather   . Heart failure Maternal Grandfather       Review of Systems  Constitutional: negative for fatigue and weight loss Respiratory: negative for cough and wheezing Cardiovascular: negative for chest pain, fatigue and palpitations Gastrointestinal: negative for abdominal pain and change in bowel habits Musculoskeletal:negative for  myalgias Neurological: negative for gait problems and tremors Behavioral/Psych: negative for abusive relationship, depression Endocrine: negative for temperature intolerance    Genitourinary:negative for abnormal menstrual periods, genital lesions, hot flashes, sexual problems.  + vaginal discharge with odor Integument/breast: negative for breast lump, breast tenderness, nipple discharge and skin lesion(s)    Objective:       BP 120/85   Pulse 98   Wt 121 lb (54.9 kg)   LMP 04/13/2016 (Exact Date)   Breastfeeding? No   BMI 20.14 kg/m  General:   alert  Skin:   no rash or abnormalities  Lungs:   clear to auscultation bilaterally  Heart:   regular rate and rhythm, S1, S2 normal, no murmur, click, rub or gallop  Breasts:   normal without suspicious masses, skin or nipple changes or axillary nodes  Abdomen:  normal findings: no organomegaly, soft, non-tender and no hernia  Pelvis:  External genitalia: normal general appearance Urinary system: urethral meatus normal and bladder without fullness, nontender Vaginal: normal without tenderness, induration or masses, + gray thin vaginal discharge present Cervix: normal appearance, friable on exam Adnexa: normal bimanual exam Uterus: anteverted and non-tender, normal size   Lab Review Urine pregnancy test Labs reviewed yes Radiologic studies reviewed no  50% of 30 min visit spent on counseling and coordination of care.    Assessment:    Healthy female exam.   Cerviciitis   Friable cervix  Chronic BV   Plan:    Education reviewed: calcium supplements, depression evaluation, low fat, low cholesterol diet, safe sex/STD prevention, self breast exams, skin cancer screening and weight bearing exercise. Contraception: Ortho-Evra patches weekly. Follow up in: 1 year.   Meds ordered this encounter  Medications  . metroNIDAZOLE (FLAGYL) 500 MG tablet    Sig: Take 4 tablets (2,000 mg total) by mouth once.    Dispense:  4 tablet     Refill:  0  . metroNIDAZOLE (METROGEL VAGINAL) 0.75 % vaginal gel    Sig: Place 1 Applicatorful vaginally 2 (two) times a week. For 4-6 months.    Dispense:  70 g    Refill:  4  . fluconazole (DIFLUCAN) 200 MG tablet    Sig: Take 1 tablet (200 mg total) by mouth once. Repeat dose in 48-72 hours.    Dispense:  3 tablet    Refill:  4  . terconazole (TERAZOL 3) 0.8 % vaginal cream    Sig: Place 1 applicator vaginally at bedtime.    Dispense:  20 g    Refill:  4  . norelgestromin-ethinyl estradiol (ORTHO EVRA) 150-35 MCG/24HR transdermal patch    Sig: Place 1 patch onto the skin once a week.    Dispense:  3 patch    Refill:  12   Orders Placed This Encounter  Procedures  .  NuSwab Vaginitis (VG)   Follow up as needed.

## 2016-05-18 LAB — CYTOLOGY - PAP: Diagnosis: NEGATIVE

## 2016-05-19 ENCOUNTER — Other Ambulatory Visit: Payer: Self-pay | Admitting: Certified Nurse Midwife

## 2016-05-19 ENCOUNTER — Encounter: Payer: Self-pay | Admitting: *Deleted

## 2016-05-19 DIAGNOSIS — A599 Trichomoniasis, unspecified: Secondary | ICD-10-CM

## 2016-05-19 DIAGNOSIS — N760 Acute vaginitis: Secondary | ICD-10-CM

## 2016-05-19 DIAGNOSIS — B9689 Other specified bacterial agents as the cause of diseases classified elsewhere: Secondary | ICD-10-CM

## 2016-05-19 LAB — NUSWAB VAGINITIS (VG)
ATOPOBIUM VAGINAE: HIGH {score} — AB
BVAB 2: HIGH {score} — AB
Candida albicans, NAA: POSITIVE — AB
Candida glabrata, NAA: NEGATIVE
MEGASPHAERA 1: HIGH {score} — AB
TRICH VAG BY NAA: POSITIVE — AB

## 2016-05-19 MED ORDER — TINIDAZOLE 500 MG PO TABS: 2.0000 g | ORAL_TABLET | Freq: Every day | ORAL | 0 refills | Status: DC

## 2016-06-02 ENCOUNTER — Encounter: Payer: Self-pay | Admitting: *Deleted

## 2016-06-14 ENCOUNTER — Encounter (HOSPITAL_COMMUNITY): Payer: Self-pay | Admitting: *Deleted

## 2016-06-14 ENCOUNTER — Inpatient Hospital Stay (HOSPITAL_COMMUNITY)
Admission: AD | Admit: 2016-06-14 | Discharge: 2016-06-14 | Disposition: A | Discharge status: Home / Self Care | Payer: Medicaid Other | Source: Ambulatory Visit | Attending: Family Medicine | Admitting: Family Medicine

## 2016-06-14 DIAGNOSIS — Z3A01 Less than 8 weeks gestation of pregnancy: Secondary | ICD-10-CM | POA: Diagnosis not present

## 2016-06-14 DIAGNOSIS — Z823 Family history of stroke: Secondary | ICD-10-CM | POA: Insufficient documentation

## 2016-06-14 DIAGNOSIS — Z841 Family history of disorders of kidney and ureter: Secondary | ICD-10-CM | POA: Diagnosis not present

## 2016-06-14 DIAGNOSIS — R51 Headache: Secondary | ICD-10-CM | POA: Insufficient documentation

## 2016-06-14 DIAGNOSIS — O26891 Other specified pregnancy related conditions, first trimester: Secondary | ICD-10-CM | POA: Diagnosis not present

## 2016-06-14 DIAGNOSIS — O2341 Unspecified infection of urinary tract in pregnancy, first trimester: Secondary | ICD-10-CM | POA: Diagnosis not present

## 2016-06-14 DIAGNOSIS — Z8619 Personal history of other infectious and parasitic diseases: Secondary | ICD-10-CM | POA: Diagnosis not present

## 2016-06-14 DIAGNOSIS — Z8249 Family history of ischemic heart disease and other diseases of the circulatory system: Secondary | ICD-10-CM | POA: Insufficient documentation

## 2016-06-14 DIAGNOSIS — Z3201 Encounter for pregnancy test, result positive: Secondary | ICD-10-CM | POA: Diagnosis not present

## 2016-06-14 DIAGNOSIS — O219 Vomiting of pregnancy, unspecified: Secondary | ICD-10-CM | POA: Diagnosis not present

## 2016-06-14 LAB — URINALYSIS, ROUTINE W REFLEX MICROSCOPIC
BILIRUBIN URINE: NEGATIVE
GLUCOSE, UA: NEGATIVE mg/dL
Ketones, ur: 5 mg/dL — AB
Nitrite: POSITIVE — AB
Protein, ur: 100 mg/dL — AB
SPECIFIC GRAVITY, URINE: 1.015 (ref 1.005–1.030)
pH: 5 (ref 5.0–8.0)

## 2016-06-14 LAB — CBC
HCT: 32.5 % — ABNORMAL LOW (ref 36.0–46.0)
HEMOGLOBIN: 10.4 g/dL — AB (ref 12.0–15.0)
MCH: 24.2 pg — AB (ref 26.0–34.0)
MCHC: 32 g/dL (ref 30.0–36.0)
MCV: 75.6 fL — ABNORMAL LOW (ref 78.0–100.0)
PLATELETS: 315 10*3/uL (ref 150–400)
RBC: 4.3 MIL/uL (ref 3.87–5.11)
RDW: 15.7 % — ABNORMAL HIGH (ref 11.5–15.5)
WBC: 13.6 10*3/uL — ABNORMAL HIGH (ref 4.0–10.5)

## 2016-06-14 LAB — COMPREHENSIVE METABOLIC PANEL
ALBUMIN: 3.6 g/dL (ref 3.5–5.0)
ALK PHOS: 88 U/L (ref 38–126)
ALT: 24 U/L (ref 14–54)
ANION GAP: 11 (ref 5–15)
AST: 23 U/L (ref 15–41)
BUN: 8 mg/dL (ref 6–20)
CHLORIDE: 99 mmol/L — AB (ref 101–111)
CO2: 24 mmol/L (ref 22–32)
Calcium: 8.9 mg/dL (ref 8.9–10.3)
Creatinine, Ser: 0.65 mg/dL (ref 0.44–1.00)
GFR calc non Af Amer: >60 mL/min (ref >60)
GLUCOSE: 87 mg/dL (ref 65–99)
Potassium: 3.7 mmol/L (ref 3.5–5.1)
SODIUM: 134 mmol/L — AB (ref 135–145)
Total Bilirubin: 0.8 mg/dL (ref 0.3–1.2)
Total Protein: 7.3 g/dL (ref 6.5–8.1)

## 2016-06-14 LAB — INFLUENZA PANEL BY PCR (TYPE A & B)
INFLAPCR: NEGATIVE
Influenza B By PCR: NEGATIVE

## 2016-06-14 LAB — WET PREP, GENITAL
CLUE CELLS WET PREP: NONE SEEN
SPERM: NONE SEEN
TRICH WET PREP: NONE SEEN

## 2016-06-14 LAB — DIFFERENTIAL
BASOS ABS: 0 10*3/uL (ref 0.0–0.1)
BASOS PCT: 0 %
EOS ABS: 0 10*3/uL (ref 0.0–0.7)
Eosinophils Relative: 0 %
LYMPHS ABS: 1.3 10*3/uL (ref 0.7–4.0)
Lymphocytes Relative: 10 %
MONO ABS: 1.2 10*3/uL — AB (ref 0.1–1.0)
MONOS PCT: 9 %
Neutro Abs: 10.8 10*3/uL — ABNORMAL HIGH (ref 1.7–7.7)
Neutrophils Relative %: 81 %

## 2016-06-14 LAB — POCT PREGNANCY, URINE: PREG TEST UR: POSITIVE — AB

## 2016-06-14 LAB — LACTIC ACID, PLASMA: LACTIC ACID, VENOUS: 1.6 mmol/L (ref 0.5–1.9)

## 2016-06-14 MED ORDER — PROMETHAZINE HCL 12.5 MG PO TABS: 12.5000 mg | ORAL_TABLET | Freq: Four times a day (QID) | ORAL | 0 refills | Status: DC | PRN

## 2016-06-14 MED ORDER — ACETAMINOPHEN 500 MG PO TABS
1000.0000 mg | ORAL_TABLET | Freq: Once | ORAL | Status: AC
  Administered 2016-06-14: 1000 mg via ORAL
  Filled 2016-06-14: qty 2

## 2016-06-14 MED ORDER — CEPHALEXIN 500 MG PO CAPS: 500.0000 mg | ORAL_CAPSULE | Freq: Four times a day (QID) | ORAL | 0 refills | Status: DC

## 2016-06-14 MED ORDER — PROMETHAZINE HCL 25 MG/ML IJ SOLN
25.0000 mg | Freq: Once | INTRAVENOUS | Status: AC
  Administered 2016-06-14: 25 mg via INTRAVENOUS
  Filled 2016-06-14: qty 1

## 2016-06-14 MED ORDER — LACTATED RINGERS IV BOLUS (SEPSIS)
1000.0000 mL | Freq: Once | INTRAVENOUS | Status: AC
  Administered 2016-06-14: 1000 mL via INTRAVENOUS

## 2016-06-14 NOTE — MAU Note (Signed)
Pt C/O vomiting today, feeling very weak, has HA.  States she had temp of 101 last night, took tylenol.  Denies diarrhea.  Pos HPT last week.  Denies abd pain or bleeding.

## 2016-06-14 NOTE — MAU Provider Note (Signed)
History     CSN: [id number]  Arrival date and time: 06/14/16 1106   First Provider Initiated Contact with Patient 06/14/16 1141      Chief Complaint  Patient presents with  . Emesis During Pregnancy  . Headache   HPI  Alison Stone is a 30 y.o. [id number] at 1332w5d by LMP who presents with n/v & headache. Had positive HPT last week. Non productive cough & fever or 101 yesterday that resolved with tylenol.  Nausea & vomiting started this morning. Reports vomiting 3 times today. Headache began after vomiting. Reports frontal headache that is throbbing in nature. Rates 8/10. Has not treated. Nothing makes better or worse. Denies abdominal pain, vaginal bleeding, vaginal discharge, diarrhea, constipation, or dysuria. Last BM this morning was normal for her. Son is sick with a cold.   OB History    Gravida Para Term Preterm AB Living   6 4 4  0 1 4   SAB TAB Ectopic Multiple Live Births   1 0 0 0 4      Past Medical History:  Diagnosis Date  . 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 THROAT SURGERY     cyst blocking airway    Family History  Problem Relation Age of Onset  . Asthma Maternal Aunt   . Kidney disease Maternal Aunt   . Migraines Maternal Aunt   . Nephrolithiasis Paternal Aunt   . Migraines Paternal Aunt   . Stroke Maternal Grandfather   . Hypertension Maternal Grandfather   . Heart failure Maternal Grandfather     Social History  Substance Use Topics  . Smoking status: Never Smoker  . Smokeless tobacco: Never Used  . Alcohol use No    Allergies: No Known Allergies  Prescriptions Prior to Admission  Medication Sig Dispense Refill Last Dose  . metroNIDAZOLE (METROGEL VAGINAL) 0.75 % vaginal gel Place 1 Applicatorful vaginally 2 (two) times a week. For 4-6 months. 70 g 4   . terconazole (TERAZOL 3) 0.8 % vaginal cream Place 1 applicator vaginally at bedtime. 20 g 4   .  tinidazole (TINDAMAX) 500 MG tablet Take 4 tablets (2,000 mg total) by mouth daily with breakfast. 8 tablet 0   . [DISCONTINUED] norelgestromin-ethinyl estradiol (ORTHO EVRA) 150-35 MCG/24HR transdermal patch Place 1 patch onto the skin once a week. 3 patch 12   . [DISCONTINUED] Norethindrone-Ethinyl Estradiol-Fe Biphas (LO LOESTRIN FE) 1 MG-10 MCG / 10 MCG tablet Take 1 tablet by mouth daily. 1 Package 11 Taking    Review of Systems  Constitutional: Positive for fever. Negative for chills.  HENT: Negative for ear pain and sore throat.   Eyes: Negative for double vision and photophobia.  Respiratory: Positive for cough. Negative for sputum production.   Gastrointestinal: Positive for nausea and vomiting. Negative for abdominal pain, constipation and diarrhea.  Genitourinary: Negative.   Neurological: Positive for weakness and headaches. Negative for dizziness.   Physical Exam   Blood pressure 95/63, pulse 92, temperature 98.5 F (36.9 C), temperature source Oral, resp. rate 18, height 5' (1.524 m), weight 122 lb (55.3 kg), last menstrual period 04/28/2016, SpO2 100 %.  Physical Exam  Nursing note and vitals reviewed. Constitutional: She is oriented to person, place, and time. She appears well-developed and well-nourished. No distress.  HENT:  Head: Normocephalic and atraumatic.  Eyes: Conjunctivae are normal. Right eye exhibits no discharge. Left eye  exhibits no discharge. No scleral icterus.  Neck: Normal range of motion.  Cardiovascular: Normal rate, regular rhythm and normal heart sounds.   No murmur heard. Respiratory: Effort normal and breath sounds normal. No respiratory distress. She has no wheezes.  GI: Soft. Bowel sounds are normal. She exhibits no distension. There is no tenderness. There is no rebound and no guarding.  Neurological: She is alert and oriented to person, place, and time.  Skin: Skin is warm and dry. She is not diaphoretic.  Psychiatric: She has a normal mood  and affect. Her behavior is normal. Judgment and thought content normal.    MAU Course  Procedures Results for orders placed or performed during the hospital encounter of 06/14/16 (from the past 24 hour(s))  Urinalysis, Routine w reflex microscopic (not at [facility])     Status: Abnormal   Collection Time: 06/14/16 11:29 AM  Result Value Ref Range   Color, Urine AMBER (A) YELLOW   APPearance CLOUDY (A) CLEAR   Specific Gravity, Urine 1.015 1.005 - 1.030   pH 5.0 5.0 - 8.0   Glucose, UA NEGATIVE NEGATIVE mg/dL   Hgb urine dipstick SMALL (A) NEGATIVE   Bilirubin Urine NEGATIVE NEGATIVE   Ketones, ur 5 (A) NEGATIVE mg/dL   Protein, ur 161 (A) NEGATIVE mg/dL   Nitrite POSITIVE (A) NEGATIVE   Leukocytes, UA LARGE (A) NEGATIVE   RBC / HPF 6-30 0 - 5 RBC/hpf   WBC, UA TOO NUMEROUS TO COUNT 0 - 5 WBC/hpf   Bacteria, UA MANY (A) NONE SEEN   Squamous Epithelial / LPF 6-30 (A) NONE SEEN   WBC Clumps PRESENT    Mucous PRESENT    Budding Yeast PRESENT   Pregnancy, urine POC     Status: Abnormal   Collection Time: 06/14/16 11:30 AM  Result Value Ref Range   Preg Test, Ur POSITIVE (A) NEGATIVE  CBC     Status: Abnormal   Collection Time: 06/14/16 12:11 PM  Result Value Ref Range   WBC 13.6 (H) 4.0 - 10.5 K/uL   RBC 4.30 3.87 - 5.11 MIL/uL   Hemoglobin 10.4 (L) 12.0 - 15.0 g/dL   HCT 09.6 (L) 04.5 - 40.9 %   MCV 75.6 (L) 78.0 - 100.0 fL   MCH 24.2 (L) 26.0 - 34.0 pg   MCHC 32.0 30.0 - 36.0 g/dL   RDW 81.1 (H) 91.4 - 78.2 %   Platelets 315 150 - 400 K/uL  Comprehensive metabolic panel     Status: Abnormal   Collection Time: 06/14/16 12:11 PM  Result Value Ref Range   Sodium 134 (L) 135 - 145 mmol/L   Potassium 3.7 3.5 - 5.1 mmol/L   Chloride 99 (L) 101 - 111 mmol/L   CO2 24 22 - 32 mmol/L   Glucose, Bld 87 65 - 99 mg/dL   BUN 8 6 - 20 mg/dL   Creatinine, Ser 9.56 0.44 - 1.00 mg/dL   Calcium 8.9 8.9 - 21.3 mg/dL   Total Protein 7.3 6.5 - 8.1 g/dL   Albumin 3.6 3.5 - 5.0 g/dL   AST 23  15 - 41 U/L   ALT 24 14 - 54 U/L   Alkaline Phosphatase 88 38 - 126 U/L   Total Bilirubin 0.8 0.3 - 1.2 mg/dL   GFR calc non Af Amer >60 >60 mL/min   GFR calc Af Amer >60 >60 mL/min   Anion gap 11 5 - 15  Differential     Status: Abnormal   Collection  Time: 06/14/16 12:11 PM  Result Value Ref Range   Neutrophils Relative % 81 %   Neutro Abs 10.8 (H) 1.7 - 7.7 K/uL   Lymphocytes Relative 10 %   Lymphs Abs 1.3 0.7 - 4.0 K/uL   Monocytes Relative 9 %   Monocytes Absolute 1.2 (H) 0.1 - 1.0 K/uL   Eosinophils Relative 0 %   Eosinophils Absolute 0.0 0.0 - 0.7 K/uL   Basophils Relative 0 %   Basophils Absolute 0.0 0.0 - 0.1 K/uL  Wet prep, genital     Status: Abnormal   Collection Time: 06/14/16 12:14 PM  Result Value Ref Range   Yeast Wet Prep HPF POC PRESENT (A) NONE SEEN   Trich, Wet Prep NONE SEEN NONE SEEN   Clue Cells Wet Prep HPF POC NONE SEEN NONE SEEN   WBC, Wet Prep HPF POC MODERATE (A) NONE SEEN   Sperm NONE SEEN   Influenza panel by PCR (type A & B, H1N1)     Status: None   Collection Time: 06/14/16  1:33 PM  Result Value Ref Range   Influenza A By PCR NEGATIVE NEGATIVE   Influenza B By PCR NEGATIVE NEGATIVE  Lactic acid, plasma     Status: None   Collection Time: 06/14/16  1:34 PM  Result Value Ref Range   Lactic Acid, Venous 1.6 0.5 - 1.9 mmol/L    MDM UPT positive CBC, CMP Phenergan 25 mg in bag of D5LR Pt declined flu swab U/a + nitrites --- urine cx sent -- pt denies dysuria, hematuria, or flank pain; neg CVAT Temp > to 101.7 -- tylenol 1 gm PO S/w Dr. Adrian BlackwaterStinson regarding VS & labs -- will continue IV fluids & order lactic acid Lactic acid 1.6 & flu swab negative Temp down with tylenol & headache resolved Assessment and Plan  A: 1. Urinary tract infection in mother during first trimester of pregnancy   2. Pregnancy headache in first trimester    P: Discharge home Rx keflex & phenergan Urine culture pending Discussed reasons to return to MAU Start  prenatal care  Judeth Hornrin Avonda Toso 06/14/2016, 11:40 AM

## 2016-06-14 NOTE — Discharge Instructions (Signed)
Pregnancy and Urinary Tract Infection WHAT IS A URINARY TRACT INFECTION? A urinary tract infection (UTI) is an infection of any part of the urinary tract. This includes the kidneys, the tubes that connect your kidneys to your bladder (ureters), the bladder, and the tube that carries urine out of your body (urethra). These organs make, store, and get rid of urine in the body. A UTI can be a bladder infection (cystitis) or a kidney infection (pyelonephritis). This infection may be caused by fungi, viruses, and bacteria. Bacteria are the most common cause of UTIs. You are more likely to develop a UTI during pregnancy because:  The physical and hormonal changes your body goes through can make it easier for bacteria to get into your urinary tract.  Your growing baby puts pressure on your uterus and can affect urine flow. DOES A UTI PLACE MY BABY AT RISK? An untreated UTI during pregnancy could lead to a kidney infection, which can cause health problems that could affect your baby. Possible complications of an untreated UTI include:  Having your baby before 37 weeks of pregnancy (premature).  Having a baby with a low birth weight.  Developing high blood pressure during pregnancy (preeclampsia). WHAT ARE THE SYMPTOMS OF A UTI? Symptoms of a UTI include:  Fever.  Frequent urination or passing small amounts of urine frequently.  Needing to urinate urgently.  Pain or a burning sensation with urination.  Urine that smells bad or unusual.  Cloudy urine.  Pain in the lower abdomen or back.  Trouble urinating.  Blood in the urine.  Vomiting or being less hungry than normal.  Diarrhea or abdominal pain.  Vaginal discharge. WHAT ARE THE TREATMENT OPTIONS FOR A UTI DURING PREGNANCY? Treatment for this condition may include:  Antibiotic medicines that are safe to take during pregnancy.  Other medicines to treat less common causes of UTI. HOW CAN I PREVENT A UTI? To prevent a UTI:  Go  to the bathroom as soon as you feel the need.  Always wipe from front to back.  Wash your genital area with soap and warm water daily.  Empty your bladder before and after sex.  Wear cotton underwear.  Limit your intake of high sugar foods or drinks, such as regular soda, juice, and sweets..  Drink 6-8 glasses of water daily.  Do not wear tight-fitting pants.  Do not douche or use deodorant sprays.  Do not drink alcohol, caffeine, or carbonated drinks. These can irritate the bladder. WHEN SHOULD I SEEK MEDICAL CARE? Seek medical care if:  Your symptoms do not improve or get worse.  You have a fever after two days of treatment.  You have a rash.  You have abnormal vaginal discharge.  You have back or side pain.  You have chills.  You have nausea and vomiting. WHEN SHOULD I SEEK IMMEDIATE MEDICAL CARE? Seek immediate medical care if you are pregnant and:  You feel contractions in your uterus.  You have lower belly pain.  You have a gush of fluid from your vagina.  You have blood in your urine.  You are vomiting and cannot keep down any medicines or water. This information is not intended to replace advice given to you by your health care provider. Make sure you discuss any questions you have with your health care provider. Document Released: 10/09/2010 Document Revised: 11/17/2015 Document Reviewed: 05/05/2015 Elsevier Interactive Patient Education  2017 [employer]vier Inc.     Morning Sickness Morning sickness is when you feel sick  to your stomach (nauseous) during pregnancy. This nauseous feeling may or may not come with vomiting. It often occurs in the morning but can be a problem any time of day. Morning sickness is most common during the first trimester, but it may continue throughout pregnancy. While morning sickness is unpleasant, it is usually harmless unless you develop severe and continual vomiting (hyperemesis gravidarum). This condition requires more  intense treatment. What are the causes? The cause of morning sickness is not completely known but seems to be related to normal hormonal changes that occur in pregnancy. What increases the risk? You are at greater risk if you:  Experienced nausea or vomiting before your pregnancy.  Had morning sickness during a previous pregnancy.  Are pregnant with more than one baby, such as twins. How is this treated? Do not use any medicines (prescription, over-the-counter, or herbal) for morning sickness without first talking to your health care provider. Your health care provider may prescribe or recommend:  Vitamin B6 supplements.  Anti-nausea medicines.  The herbal medicine ginger. Follow these instructions at home:  Only take over-the-counter or prescription medicines as directed by your health care provider.  Taking multivitamins before getting pregnant can prevent or decrease the severity of morning sickness in most women.  Eat a piece of dry toast or unsalted crackers before getting out of bed in the morning.  Eat five or six small meals a day.  Eat dry and bland foods (rice, baked potato). Foods high in carbohydrates are often helpful.  Do not drink liquids with your meals. Drink liquids between meals.  Avoid greasy, fatty, and spicy foods.  Get someone to cook for you if the smell of any food causes nausea and vomiting.  If you feel nauseous after taking prenatal vitamins, take the vitamins at night or with a snack.  Snack on protein foods (nuts, yogurt, cheese) between meals if you are hungry.  Eat unsweetened gelatins for desserts.  Wearing an acupressure wristband (worn for sea sickness) may be helpful.  Acupuncture may be helpful.  Do not smoke.  Get a humidifier to keep the air in your house free of odors.  Get plenty of fresh air. Contact a health care provider if:  Your home remedies are not working, and you need medicine.  You feel dizzy or  lightheaded.  You are losing weight. Get help right away if:  You have persistent and uncontrolled nausea and vomiting.  You pass out (faint). This information is not intended to replace advice given to you by your health care provider. Make sure you discuss any questions you have with your health care provider. Document Released: 08/05/2006 Document Revised: 11/20/2015 Document Reviewed: 11/29/2012 Elsevier Interactive Patient Education  2017 [employer]

## 2016-06-16 LAB — CULTURE, OB URINE: Culture: >=100000 — AB

## 2016-06-17 DIAGNOSIS — N390 Urinary tract infection, site not specified: Secondary | ICD-10-CM

## 2016-06-28 NOTE — L&D Delivery Note (Signed)
Alison Stone is a 31 y.o. female [phone] with IUP at 3341w0d by 25 wk sono that presented for IOL for A1GDM.  Patient was induced with pitocin and a foley bulb.  Her membranes were ruptured at 2055 on 7/27.  Delivery Note At 3:27 AM a viable female was delivered via  (Presentation: OP).  APGAR: 9, 9; weight pending .   Placenta status: intact.  Cord: 3v with the following complications: None.  Cord pH: Not obtained  Anesthesia:  Epidural Episiotomy:  Not needed Lacerations:  None. Suture Repair: Not needed Est. Blood Loss (mL):  50  Mom to postpartum.  Baby to Couplet care / Skin to Skin.  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  She progressed with augmentation via foley bulb and Pitocin and AROM.  I was gloved and present for delivery in its entirety.  Second stage of labor progressed slower than expected due to being a multip, but baby delivered direct OP.  Complications: none  Lacerations: none  EBL: 50cc  Cam HaiSHAW, KIMBERLY, CNM 9:35 AM 01/22/2017

## 2016-09-22 ENCOUNTER — Inpatient Hospital Stay (HOSPITAL_COMMUNITY)
Admission: AD | Admit: 2016-09-22 | Discharge: 2016-09-22 | Disposition: A | Discharge status: Home / Self Care | Payer: Medicaid Other | Source: Ambulatory Visit | Attending: Obstetrics & Gynecology | Admitting: Obstetrics & Gynecology

## 2016-09-22 ENCOUNTER — Encounter (HOSPITAL_COMMUNITY): Payer: Self-pay

## 2016-09-22 DIAGNOSIS — O26892 Other specified pregnancy related conditions, second trimester: Secondary | ICD-10-CM | POA: Diagnosis not present

## 2016-09-22 DIAGNOSIS — Z711 Person with feared health complaint in whom no diagnosis is made: Secondary | ICD-10-CM

## 2016-09-22 DIAGNOSIS — Z3A21 21 weeks gestation of pregnancy: Secondary | ICD-10-CM | POA: Insufficient documentation

## 2016-09-22 DIAGNOSIS — O0932 Supervision of pregnancy with insufficient antenatal care, second trimester: Secondary | ICD-10-CM | POA: Insufficient documentation

## 2016-09-22 NOTE — MAU Provider Note (Signed)
History     CSN: [id number]  Arrival date and time: 09/22/16 1003   None     Chief Complaint  Patient presents with  . Initial Prenatal Visit   HPI Pt is a 31y/o [id number] at 21 weeks by LMP with no significant medical or OB hx who presents today with no complaints. Her main goal today is to get an ultrasound because she has not yet been able to establish care. She was unable to seek care at first due to problems with her insurance but since getting insurance she has made multiple attempts to establish care. She was told this week that [facility] would call her soon to give her an appointment date. Her largest child was 8lbs and smallest 5lbs 12oz. She denies any smoking, etoh, or illicit drugs. No concerns for STI.   Past Medical History:  Diagnosis Date  . 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 THROAT SURGERY     cyst blocking airway    Family History  Problem Relation Age of Onset  . Asthma Maternal Aunt   . Kidney disease Maternal Aunt   . Migraines Maternal Aunt   . Nephrolithiasis Paternal Aunt   . Migraines Paternal Aunt   . Stroke Maternal Grandfather   . Hypertension Maternal Grandfather   . Heart failure Maternal Grandfather     Social History  Substance Use Topics  . Smoking status: Never Smoker  . Smokeless tobacco: Never Used  . Alcohol use No    Allergies: No Known Allergies  Prescriptions Prior to Admission  Medication Sig Dispense Refill Last Dose  . acetaminophen (TYLENOL) 500 MG tablet Take 1,000 mg by mouth every 6 (six) hours as needed for headache.   Past Week at Unknown time  . Prenatal MV & Min w/FA-DHA (PRENATAL ADULT GUMMY/DHA/FA PO) Take 2 tablets by mouth daily.   09/22/2016 at Unknown time  . cephALEXin (KEFLEX) 500 MG capsule Take 1 capsule (500 mg total) by mouth 4 (four) times daily. 28 capsule 0   . promethazine (PHENERGAN) 12.5 MG tablet Take 1 tablet  (12.5 mg total) by mouth every 6 (six) hours as needed for nausea or vomiting. 30 tablet 0     Review of Systems  Constitutional: Negative for chills and fever.  Respiratory: Negative for chest tightness and shortness of breath.   Cardiovascular: Negative for chest pain, palpitations and leg swelling.  Gastrointestinal: Negative for abdominal pain, blood in stool, constipation, diarrhea, nausea and vomiting.  Genitourinary: Negative for difficulty urinating, dysuria, flank pain, vaginal bleeding and vaginal discharge.  Neurological: Negative for dizziness.   Physical Exam   Blood pressure 112/72, pulse (!) 101, temperature 98.4 F (36.9 C), temperature source Axillary, resp. rate 16, last menstrual period 04/28/2016, SpO2 98 %.  Physical Exam  Constitutional: She is oriented to person, place, and time. She appears well-developed and well-nourished.  HENT:  Head: Normocephalic and atraumatic.  Cardiovascular: Normal rate, regular rhythm and normal heart sounds.  Exam reveals no gallop and no friction rub.   No murmur heard. Respiratory: Effort normal and breath sounds normal.  GI: Soft. There is no tenderness. There is no rebound and no guarding.  Uterus felt at level of umbilicus   Neurological: She is alert and oriented to person, place, and time.  Skin: Skin is warm and dry.  Psychiatric: She has a normal mood and affect.  FHT: 167 MAU Course  Procedures  MDM No concerning sx today. Review of records shows no significant hx.  Will schedule pt for outpt US. [facility] scheduled appt for NOB asap. Reassured pt. Stable for discharge home.   Assessment and Plan   1. [redacted] weeks gestation of pregnancy   2. Physically well but worried   3. No prenatal care in current pregnancy in second trimester    [facility] Follow up for outpt US Second trimester anticipatory guidance Continue prenatal  vitamins  Allergies as of 09/22/2016   No Known Allergies     Medication List    STOP taking these medications   cephALEXin 500 MG capsule Commonly known as:  KEFLEX   promethazine 12.5 MG tablet Commonly known as:  PHENERGAN     TAKE these medications   acetaminophen 500 MG tablet Commonly known as:  TYLENOL Take 1,000 mg by mouth every 6 (six) hours as needed for headache.   PRENATAL ADULT GUMMY/DHA/FA PO Take 2 tablets by mouth daily.      Melburn PopperJames Kirby  PA-Student  09/22/2016, 10:59 AM   I confirm that I have verified the information documented in the student's note and that I have also personally reperformed the physical exam and all medical decision making activities.  Donette LarryMelanie Brayon Bielefeld, CNM  09/22/2016 11:33 AM

## 2016-09-22 NOTE — Discharge Instructions (Signed)

## 2016-09-22 NOTE — MAU Note (Signed)
Patient presents complaining about having tried getting in to see a doctor but not being able to be seen. States worried because she's so far along and has not hear a heartbeat or been checked by OB. Denies pain and vaginal discharge/bleeding.

## 2016-09-22 NOTE — Progress Notes (Signed)
G6P4 [redacted] wksga. Presents to triage bc wants an ultra sound and can't get an OB appt. Pt stated [facility] office would call sometime next week but did not want to wait so is here to be seen and wants an ultra sound to get her due date. No lof or bleeding.

## 2016-10-11 ENCOUNTER — Other Ambulatory Visit (HOSPITAL_COMMUNITY)
Admission: RE | Admit: 2016-10-11 | Discharge: 2016-10-11 | Disposition: A | Discharge status: Home / Self Care | Payer: Medicaid Other | Source: Ambulatory Visit | Attending: Certified Nurse Midwife | Admitting: Certified Nurse Midwife

## 2016-10-11 ENCOUNTER — Ambulatory Visit (INDEPENDENT_AMBULATORY_CARE_PROVIDER_SITE_OTHER): Payer: Medicaid Other | Admitting: Certified Nurse Midwife

## 2016-10-11 ENCOUNTER — Encounter: Payer: Self-pay | Admitting: Certified Nurse Midwife

## 2016-10-11 VITALS — BP 121/71 | HR 118 | Wt 135.0 lb

## 2016-10-11 DIAGNOSIS — Z3492 Encounter for supervision of normal pregnancy, unspecified, second trimester: Secondary | ICD-10-CM | POA: Insufficient documentation

## 2016-10-11 DIAGNOSIS — O0993 Supervision of high risk pregnancy, unspecified, third trimester: Secondary | ICD-10-CM | POA: Insufficient documentation

## 2016-10-11 DIAGNOSIS — Z113 Encounter for screening for infections with a predominantly sexual mode of transmission: Secondary | ICD-10-CM | POA: Diagnosis not present

## 2016-10-11 DIAGNOSIS — N3000 Acute cystitis without hematuria: Secondary | ICD-10-CM

## 2016-10-11 DIAGNOSIS — Z3A25 25 weeks gestation of pregnancy: Secondary | ICD-10-CM | POA: Diagnosis not present

## 2016-10-11 LAB — POCT URINALYSIS DIP (DEVICE)
Bilirubin Urine: NEGATIVE
GLUCOSE, UA: NEGATIVE mg/dL
Ketones, ur: NEGATIVE mg/dL
Leukocytes, UA: NEGATIVE
NITRITE: POSITIVE — AB
PH: 7 (ref 5.0–8.0)
PROTEIN: 30 mg/dL — AB
Specific Gravity, Urine: 1.015 (ref 1.005–1.030)
Urobilinogen, UA: 2 mg/dL — ABNORMAL HIGH (ref 0.0–1.0)

## 2016-10-11 NOTE — Progress Notes (Signed)
Subjective:  Alison Stone is a 31 y.o. [phone] at [redacted]w[redacted]d being seen today for her first prenatal care visit.  She is currently monitored for the following issues for this low-risk pregnancy and has UTI (urinary tract infection) and Supervision of low-risk pregnancy, second trimester on her problem list.  Patient reports no complaints.  Contractions: Not present. Vag. Bleeding: None.  Movement: Present. Denies leaking of fluid.   The following portions of the patient's history were reviewed and updated as appropriate: allergies, current medications, past family history, past medical history, past social history, past surgical history and problem list. Problem list updated.  Objective:   Vitals:   10/11/16 1032  BP: 121/71  Pulse: (!) 118  Weight: 135 lb (61.2 kg)    Fetal Status: Fetal Heart Rate (bpm): 175   Movement: Present     General:  Alert, oriented and cooperative. Patient is in no acute distress.  Skin: Skin is warm and dry. No rash noted.   Cardiovascular: Normal heart rate noted  Respiratory: Normal respiratory effort, no problems with respiration noted  Abdomen: Soft, gravid, appropriate for gestational age. Pain/Pressure: Present     Pelvic: Vag. Bleeding: None     Cervical exam deferred        Extremities: Normal range of motion.  Edema: None  Mental Status: Normal mood and affect. Normal behavior. Normal judgment and thought content.   Urinalysis: Urine Protein: 1+ Urine Glucose: Negative  Assessment and Plan:  Pregnancy: [id number] at [redacted]w[redacted]d  1. Supervision of low-risk pregnancy, second trimester - Cervicovaginal ancillary only - Hemoglobinopathy Evaluation - Obstetric Panel, Including HIV - Culture, OB Urine - [place] MFM OB Complete US scheduled  2. Acute cystitis without hematuria (December 2017) - UC today  Preterm labor symptoms and general obstetric precautions including but not limited to vaginal bleeding, contractions, leaking of fluid and fetal movement  were reviewed in detail with the patient. Please refer to After Visit Summary for other counseling recommendations.  Return in about 5 weeks (around 11/15/2016).   Donette Larry, CNM

## 2016-10-11 NOTE — Progress Notes (Signed)
Initial prenatal education packet given Breastfeeding education done Anatomy u/s already scheduled for 4/18 Initial prenatal labs today Declines flu vaccine

## 2016-10-12 LAB — OBSTETRIC PANEL, INCLUDING HIV
ANTIBODY SCREEN: NEGATIVE
BASOS: 0 %
Basophils Absolute: 0 10*3/uL (ref 0.0–0.2)
EOS (ABSOLUTE): 0 10*3/uL (ref 0.0–0.4)
EOS: 0 %
HEMATOCRIT: 29.2 % — AB (ref 34.0–46.6)
HEP B S AG: NEGATIVE
HIV Screen 4th Generation wRfx: NONREACTIVE
Hemoglobin: 9.1 g/dL — ABNORMAL LOW (ref 11.1–15.9)
IMMATURE GRANS (ABS): 0.1 10*3/uL (ref 0.0–0.1)
Immature Granulocytes: 1 %
LYMPHS: 7 %
Lymphocytes Absolute: 1.2 10*3/uL (ref 0.7–3.1)
MCH: 22.2 pg — ABNORMAL LOW (ref 26.6–33.0)
MCHC: 31.2 g/dL — ABNORMAL LOW (ref 31.5–35.7)
MCV: 71 fL — ABNORMAL LOW (ref 79–97)
MONOCYTES: 15 %
Monocytes Absolute: 2.6 10*3/uL — ABNORMAL HIGH (ref 0.1–0.9)
Neutrophils Absolute: 12.9 10*3/uL — ABNORMAL HIGH (ref 1.4–7.0)
Neutrophils: 77 %
Platelets: 272 10*3/uL (ref 150–379)
RBC: 4.1 x10E6/uL (ref 3.77–5.28)
RDW: 16.6 % — AB (ref 12.3–15.4)
RPR: NONREACTIVE
RUBELLA: 4.63 {index} (ref >0.99)
Rh Factor: POSITIVE
WBC: 16.9 10*3/uL — ABNORMAL HIGH (ref 3.4–10.8)

## 2016-10-12 LAB — HEMOGLOBINOPATHY EVALUATION
Ferritin: 13 ng/mL — ABNORMAL LOW (ref 15–150)
HGB F QUANT: 0 % (ref 0.0–2.0)
HGB SOLUBILITY: NEGATIVE
Hgb A2 Quant: 2 % (ref 1.8–3.2)
Hgb A: 98 % (ref 96.4–98.8)
Hgb C: 0 %
Hgb S: 0 %
Hgb Variant: 0 %

## 2016-10-12 LAB — CERVICOVAGINAL ANCILLARY ONLY
BACTERIAL VAGINITIS: NEGATIVE
CANDIDA VAGINITIS: NEGATIVE
Chlamydia: NEGATIVE
Neisseria Gonorrhea: NEGATIVE
Trichomonas: NEGATIVE

## 2016-10-13 ENCOUNTER — Encounter: Payer: Self-pay | Admitting: Certified Nurse Midwife

## 2016-10-13 ENCOUNTER — Ambulatory Visit (HOSPITAL_COMMUNITY)
Admission: RE | Admit: 2016-10-13 | Discharge: 2016-10-13 | Disposition: A | Discharge status: Home / Self Care | Payer: Medicaid Other | Source: Ambulatory Visit | Attending: Certified Nurse Midwife | Admitting: Certified Nurse Midwife

## 2016-10-13 ENCOUNTER — Telehealth: Payer: Self-pay | Admitting: General Practice

## 2016-10-13 DIAGNOSIS — Z363 Encounter for antenatal screening for malformations: Secondary | ICD-10-CM | POA: Diagnosis present

## 2016-10-13 DIAGNOSIS — Z3492 Encounter for supervision of normal pregnancy, unspecified, second trimester: Secondary | ICD-10-CM

## 2016-10-13 DIAGNOSIS — O99012 Anemia complicating pregnancy, second trimester: Secondary | ICD-10-CM

## 2016-10-13 DIAGNOSIS — D509 Iron deficiency anemia, unspecified: Secondary | ICD-10-CM | POA: Insufficient documentation

## 2016-10-13 DIAGNOSIS — O99019 Anemia complicating pregnancy, unspecified trimester: Secondary | ICD-10-CM

## 2016-10-13 DIAGNOSIS — Z3A25 25 weeks gestation of pregnancy: Secondary | ICD-10-CM | POA: Insufficient documentation

## 2016-10-13 DIAGNOSIS — O0932 Supervision of pregnancy with insufficient antenatal care, second trimester: Secondary | ICD-10-CM | POA: Diagnosis not present

## 2016-10-13 DIAGNOSIS — Z3A21 21 weeks gestation of pregnancy: Secondary | ICD-10-CM

## 2016-10-13 MED ORDER — POLYSACCHARIDE IRON COMPLEX 150 MG PO CAPS: 150.0000 mg | ORAL_CAPSULE | Freq: Two times a day (BID) | ORAL | 6 refills | Status: DC

## 2016-10-13 NOTE — Telephone Encounter (Signed)
Per Donette Larry, patient is anemic and needs to take niferex  BID, but 1 tab daily for first week. Med ordered. Called patient, no answer- left message that we are trying to reach you with results. I will send you a mychart message since I missed you, please check your account.

## 2016-10-14 ENCOUNTER — Telehealth: Payer: Self-pay | Admitting: *Deleted

## 2016-10-14 DIAGNOSIS — N390 Urinary tract infection, site not specified: Secondary | ICD-10-CM

## 2016-10-14 LAB — CULTURE, OB URINE

## 2016-10-14 LAB — URINE CULTURE, OB REFLEX

## 2016-10-14 MED ORDER — CEPHALEXIN 500 MG PO CAPS: 500.0000 mg | ORAL_CAPSULE | Freq: Four times a day (QID) | ORAL | 0 refills | Status: AC

## 2016-10-14 NOTE — Telephone Encounter (Signed)
Called patient and informed her of uti, keflex called to pharmacy. Patient voiced understanding.

## 2016-10-14 NOTE — Telephone Encounter (Signed)
-----   Message from Donette Larry, [place] sent at 10/14/2016  1:35 PM EDT ----- Regarding: UTI Needs Keflex 500 mg po 4 times a day x7 days for UTI. Thanks.

## 2016-11-12 ENCOUNTER — Encounter: Payer: Self-pay | Admitting: Medical

## 2016-11-16 ENCOUNTER — Ambulatory Visit (INDEPENDENT_AMBULATORY_CARE_PROVIDER_SITE_OTHER): Payer: Medicaid Other | Admitting: Medical

## 2016-11-16 DIAGNOSIS — Z3483 Encounter for supervision of other normal pregnancy, third trimester: Secondary | ICD-10-CM

## 2016-11-16 DIAGNOSIS — Z3492 Encounter for supervision of normal pregnancy, unspecified, second trimester: Secondary | ICD-10-CM

## 2016-11-16 LAB — POCT URINALYSIS DIP (DEVICE)
BILIRUBIN URINE: NEGATIVE
Glucose, UA: NEGATIVE mg/dL
Hgb urine dipstick: NEGATIVE
KETONES UR: NEGATIVE mg/dL
Leukocytes, UA: NEGATIVE
Nitrite: POSITIVE — AB
PH: 6.5 (ref 5.0–8.0)
PROTEIN: NEGATIVE mg/dL
SPECIFIC GRAVITY, URINE: 1.025 (ref 1.005–1.030)
Urobilinogen, UA: 1 mg/dL (ref 0.0–1.0)

## 2016-11-16 NOTE — Patient Instructions (Addendum)
Glucose Tolerance Test The glucose tolerance test (GTT) is one of several tests used to diagnose diabetes mellitus. The GTT is a blood test, and it may include a urine test as well. The GTT checks to see how your body processes sugar (glucose). For this test, you will consume a drink containing a high level of glucose. Your blood glucose levels will be checked before you consume the drink and then again 1, 2, 3, and possibly 4 hours after you consume it. Your health care provider may recommend that you have the GTT if you:  Have a family history of diabetes.  Are very overweight (obese).  Have experienced infections that keep coming back.  Have had numerous cuts or wounds that did not heal quickly, especially on your legs and feet.  Are a woman and have a history of giving birth to very large babies or a history of repeated fetal loss (stillbirth).  Have had glucose in your urine or high blood sugar: ? During pregnancy. ? After a heart attack, surgery, or prolonged periods of high stress.  The GTT lasts 3-4 hours. Other than the glucose solution, you will not be allowed to eat or drink anything during the test. You must remain at the testing location to make sure that your blood and urine samples are taken on time. How do I prepare for this test? Eat normally for 3 days prior to the GTT test, including having plenty of carbohydrate-rich foods. Do not eat or drink anything except water during the final 12 hours before the test. You should not smoke or exercise during the test. In addition, your health care provider may ask you to stop taking certain medicines before the test. What do the results mean? It is your responsibility to obtain your test results. Ask the lab or department performing the test when and how you will get your results. Contact your health care provider to discuss any questions you have about your results. Range of Normal Values Ranges for normal values may vary among  different labs and hospitals. You should always check with your health care provider after having lab work or other tests done to discuss whether your values are considered within normal limits. Normal levels of blood glucose are as follows:  Fasting: less than 110 mg/dL or less than 6.1 mmol/L (SI units).  1 hour after consuming the glucose drink: less than 200 mg/dL or less than 11.1 mmol/L.  2 hours after consuming the glucose drink: less than 140 mg/dL or less than 7.8 mmol/L.  3 hours after consuming the glucose drink: 70-115 mg/dL or less than 6.4 mmol/L.  4 hours after consuming the glucose drink: 70-115 mg/dL or less than 6.4 mmol/L.  The normal result for the urine test is negative, meaning that glucose is absent from your urine. Some substances can interfere with GTT results. These may include:  Blood pressure and heart failure medicines, including beta blockers, furosemide, and thiazides.  Anti-inflammatory medicines, including aspirin.  Nicotine.  Some psychiatric medicines.  Oral contraceptives.  Diuretics or corticosteroids.  Meaning of Results Outside Normal Value Ranges GTT test results that are above normal values may indicate health problems, such as:  Diabetes mellitus.  Acute stress response.  Cushing syndrome.  Tumors such as pheochromocytoma or glucagonoma.  Chronic renal failure.  Pancreatitis.  Hyperthyroidism.  Current infection.  Discuss your test results with your health care provider. He or she will use the results to make a diagnosis and determine a treatment plan   right for you. Talk with your health care provider to discuss your results, treatment options, and if necessary, the need for more tests. Talk with your health care provider if you have any questions about your results. This information is not intended to replace advice given to you by your health care provider. Make sure you discuss any questions you have with your health  care provider. Document Released: 07/07/2004 Document Revised: 02/17/2016 Document Reviewed: 10/19/2013 Elsevier Interactive Patient Education  2017 [employer]. Second Trimester of Pregnancy The second trimester is from week 13 through week 28, month 4 through 6. This is often the time in pregnancy that you feel your best. Often times, morning sickness has lessened or quit. You may have more energy, and you may get hungry more often. Your unborn baby (fetus) is growing rapidly. At the end of the sixth month, he or she is about 9 inches long and weighs about 1 pounds. You will likely feel the baby move (quickening) between 18 and 20 weeks of pregnancy. Follow these instructions at home:  Avoid all smoking, herbs, and alcohol. Avoid drugs not approved by your doctor.  Do not use any tobacco products, including cigarettes, chewing tobacco, and electronic cigarettes. If you need help quitting, ask your doctor. You may get counseling or other support to help you quit.  Only take medicine as told by your doctor. Some medicines are safe and some are not during pregnancy.  Exercise only as told by your doctor. Stop exercising if you start having cramps.  Eat regular, healthy meals.  Wear a good support bra if your breasts are tender.  Do not use hot tubs, steam rooms, or saunas.  Wear your seat belt when driving.  Avoid raw meat, uncooked cheese, and liter boxes and soil used by cats.  Take your prenatal vitamins.  Take 1500-2000 milligrams of calcium daily starting at the 20th week of pregnancy until you deliver your baby.  Try taking medicine that helps you poop (stool softener) as needed, and if your doctor approves. Eat more fiber by eating fresh fruit, vegetables, and whole grains. Drink enough fluids to keep your pee (urine) clear or pale yellow.  Take warm water baths (sitz baths) to soothe pain or discomfort caused by hemorrhoids. Use hemorrhoid cream if your doctor approves.  If  you have puffy, bulging veins (varicose veins), wear support hose. Raise (elevate) your feet for 15 minutes, 3-4 times a day. Limit salt in your diet.  Avoid heavy lifting, wear low heals, and sit up straight.  Rest with your legs raised if you have leg cramps or low back pain.  Visit your dentist if you have not gone during your pregnancy. Use a soft toothbrush to brush your teeth. Be gentle when you floss.  You can have sex (intercourse) unless your doctor tells you not to.  Go to your doctor visits. Get help if:  You feel dizzy.  You have mild cramps or pressure in your lower belly (abdomen).  You have a nagging pain in your belly area.  You continue to feel sick to your stomach (nauseous), throw up (vomit), or have watery poop (diarrhea).  You have bad smelling fluid coming from your vagina.  You have pain with peeing (urination). Get help right away if:  You have a fever.  You are leaking fluid from your vagina.  You have spotting or bleeding from your vagina.  You have severe belly cramping or pain.  You lose or gain weight rapidly.  You have trouble catching your breath and have chest pain.  You notice sudden or extreme puffiness (swelling) of your face, hands, ankles, feet, or legs.  You have not felt the baby move in over an hour.  You have severe headaches that do not go away with medicine.  You have vision changes. This information is not intended to replace advice given to you by your health care provider. Make sure you discuss any questions you have with your health care provider. Document Released: 09/08/2009 Document Revised: 11/20/2015 Document Reviewed: 08/15/2012 Elsevier Interactive Patient Education  2017 [employer]

## 2016-11-16 NOTE — Progress Notes (Signed)
Patient declined the T-Dap

## 2016-11-16 NOTE — Progress Notes (Signed)
DECLINED TDAP 

## 2016-11-16 NOTE — Progress Notes (Signed)
   PRENATAL VISIT NOTE  Subjective:  Alison Stone is a 31 y.o. [phone] at 4111w4d being seen today for ongoing prenatal care.  She is currently monitored for the following issues for this low-risk pregnancy and has UTI (urinary tract infection); Supervision of low-risk pregnancy, second trimester; and Iron deficiency anemia during pregnancy on her problem list.  Patient reports no complaints.  Contractions: Not present. Vag. Bleeding: None.  Movement: Present. Denies leaking of fluid.   The following portions of the patient's history were reviewed and updated as appropriate: allergies, current medications, past family history, past medical history, past social history, past surgical history and problem list. Problem list updated.  Objective:   Vitals:   11/16/16 0919  BP: 136/72  Pulse: (!) 108  Weight: 143 lb 3.2 oz (65 kg)    Fetal Status: Fetal Heart Rate (bpm): 155 Fundal Height: 30 cm Movement: Present     General:  Alert, oriented and cooperative. Patient is in no acute distress.  Skin: Skin is warm and dry. No rash noted.   Cardiovascular: Normal heart rate noted  Respiratory: Normal respiratory effort, no problems with respiration noted  Abdomen: Soft, gravid, appropriate for gestational age. Pain/Pressure: Absent     Pelvic:  Cervical exam deferred        Extremities: Normal range of motion.  Edema: None  Mental Status: Normal mood and affect. Normal behavior. Normal judgment and thought content.   Assessment and Plan:  Pregnancy: [id number] at 2411w4d  1. Supervision of low-risk pregnancy, second trimester - Glucose Tolerance, 2 Hours w/1 Hour - CBC - HIV antibody - RPR - UA today - +Nitrites, Neg leukocytes, treated after last visit for UTI with Keflex, asymptomatic today and did complete course of antibiotics - urine culture sent   Preterm labor symptoms and general obstetric precautions including but not limited to vaginal bleeding, contractions, leaking of fluid  and fetal movement were reviewed in detail with the patient. Please refer to After Visit Summary for other counseling recommendations.  Return in about 2 weeks (around 11/30/2016) for LOB.   Vonzella NippleJulie Kennedee Kitzmiller, PA-C

## 2016-11-17 LAB — CBC
Hematocrit: 30 % — ABNORMAL LOW (ref 34.0–46.6)
Hemoglobin: 9.1 g/dL — ABNORMAL LOW (ref 11.1–15.9)
MCH: 21.4 pg — AB (ref 26.6–33.0)
MCHC: 30.3 g/dL — ABNORMAL LOW (ref 31.5–35.7)
MCV: 71 fL — AB (ref 79–97)
PLATELETS: 255 10*3/uL (ref 150–379)
RBC: 4.25 x10E6/uL (ref 3.77–5.28)
RDW: 17.5 % — AB (ref 12.3–15.4)
WBC: 10.7 10*3/uL (ref 3.4–10.8)

## 2016-11-17 LAB — RPR: RPR Ser Ql: NONREACTIVE

## 2016-11-17 LAB — GLUCOSE TOLERANCE, 2 HOURS W/ 1HR
GLUCOSE, 1 HOUR: 116 mg/dL (ref 65–179)
GLUCOSE, FASTING: 94 mg/dL — AB (ref 65–91)
Glucose, 2 hour: 103 mg/dL (ref 65–152)

## 2016-11-17 LAB — HIV ANTIBODY (ROUTINE TESTING W REFLEX): HIV SCREEN 4TH GENERATION: NONREACTIVE

## 2016-11-18 LAB — URINE CULTURE, OB REFLEX

## 2016-11-18 LAB — CULTURE, OB URINE

## 2016-11-19 ENCOUNTER — Telehealth: Payer: Self-pay | Admitting: Medical

## 2016-11-19 ENCOUNTER — Other Ambulatory Visit: Payer: Self-pay | Admitting: Medical

## 2016-11-19 DIAGNOSIS — N390 Urinary tract infection, site not specified: Principal | ICD-10-CM

## 2016-11-19 DIAGNOSIS — B952 Enterococcus as the cause of diseases classified elsewhere: Secondary | ICD-10-CM

## 2016-11-19 MED ORDER — NITROFURANTOIN MONOHYD MACRO 100 MG PO CAPS: 100.0000 mg | ORAL_CAPSULE | Freq: Two times a day (BID) | ORAL | 0 refills | Status: DC

## 2016-11-19 NOTE — Telephone Encounter (Signed)
Patient returned call. Informed of UTI and Rx at pharmacy. Advised increased PO hydration and discussed warning signs for pyelonephritis. Patient voiced understanding.   Marny LowensteinWenzel, Julie N, PA-C 11/19/2016 1:02 PM

## 2016-12-06 ENCOUNTER — Ambulatory Visit (INDEPENDENT_AMBULATORY_CARE_PROVIDER_SITE_OTHER): Payer: Medicaid Other | Admitting: Advanced Practice Midwife

## 2016-12-06 VITALS — BP 120/79 | HR 81 | Wt 147.2 lb

## 2016-12-06 DIAGNOSIS — O0993 Supervision of high risk pregnancy, unspecified, third trimester: Secondary | ICD-10-CM

## 2016-12-06 DIAGNOSIS — O24419 Gestational diabetes mellitus in pregnancy, unspecified control: Secondary | ICD-10-CM

## 2016-12-06 DIAGNOSIS — O24410 Gestational diabetes mellitus in pregnancy, diet controlled: Secondary | ICD-10-CM | POA: Diagnosis not present

## 2016-12-06 DIAGNOSIS — Z8632 Personal history of gestational diabetes: Secondary | ICD-10-CM | POA: Insufficient documentation

## 2016-12-06 LAB — GLUCOSE, CAPILLARY: GLUCOSE-CAPILLARY: 111 mg/dL — AB (ref 65–99)

## 2016-12-06 NOTE — Patient Instructions (Signed)
Gestational Diabetes Mellitus, Diagnosis Gestational diabetes (gestational diabetes mellitus) is a temporary form of diabetes that some women develop during pregnancy. It usually occurs around weeks 24-28 of pregnancy and goes away after delivery. Hormonal changes during pregnancy can interfere with insulin production and function, which may result in one or both of these problems:  The pancreas does not make enough of a hormone called insulin.  Cells in the body do not respond properly to insulin that the body makes (insulin resistance).  Normally, insulin allows sugars (glucose) to enter cells in the body. The cells use glucose for energy. Insulin resistance or lack of insulin causes excess glucose to build up in the blood instead of going into cells. As a result, high blood glucose (hyperglycemia) develops. What are the risks? If gestational diabetes is treated, it is unlikely to cause problems. If it is not controlled with treatment, it may cause problems during labor and delivery, and some of those problems can be harmful to the unborn baby (fetus) and the mother. Uncontrolled gestational diabetes may also cause the newborn baby to have breathing problems and low blood glucose. Women who get gestational diabetes are more likely to develop it if they get pregnant again, and they are more likely to develop type 2 diabetes in the future. What increases the risk? This condition may be more likely to develop in pregnant women who:  Are older than age 25 during pregnancy.  Have a family history of diabetes.  Are overweight.  Had gestational diabetes in the past.  Have polycystic ovarian syndrome (PCOS).  Are pregnant with twins or multiples.  Are of American-Indian, African-American, Hispanic/Latino, or Asian/Pacific Islander descent.  What are the signs or symptoms? Most women do not notice symptoms of gestational diabetes because the symptoms are similar to normal symptoms of pregnancy.  Symptoms of gestational diabetes may include:  Increased thirst (polydipsia).  Increased hunger(polyphagia).  Increased urination (polyuria).  How is this diagnosed?  This condition may be diagnosed based on your blood glucose level, which may be checked with one or more of the following blood tests:  A fasting blood glucose (FBG) test. You will not be allowed to eat (you will fast) for at least 8 hours before a blood sample is taken.  A random blood glucose test. This checks your blood glucose at any time of day regardless of when you ate.  An oral glucose tolerance test (OGTT). This is usually done during weeks 24-28 of pregnancy. ? For this test, you will have an FBG test done. Then, you will drink a beverage that contains glucose. Your blood glucose will be tested again 1 hour after drinking the glucose beverage (1-hour OGTT). ? If the 1-hour OGTT result is at or above 140 mg/dL (7.8 mmol/L), you will repeat the OGTT. This time, your blood glucose will be tested 3 hours after drinking the glucose beverage (3-hour OGTT).  If you have risk factors, you may be screened for undiagnosed type 2 diabetes at your first health care visit during your pregnancy (prenatal visit). How is this treated?  Your treatment may be managed by a specialist called an endocrinologist. This condition is treated by following instructions from your health care provider about:  Eating a healthier diet and getting more physical activity. These changes are the most important ways to manage gestational diabetes.  Checking your blood glucose. Do this as often as told.  Taking diabetes medicines or insulin every day. These will only be prescribed if they are   needed. ? If you use insulin, you may need to adjust your dosage based on how physically active you are and what foods you eat. Your health care provider will tell you how to do this.  Your health care provider will set treatment goals for you based on the  stage of your pregnancy and any other medical conditions you have. Generally, the goal of treatment is to maintain the following blood glucose levels during pregnancy:  Fasting: at or below 95 mg/dL (5.3 mmol/L).  After meals (postprandial): ? One hour after a meal: at or below 140 mg/dL (7.8 mmol/L). ? Two hours after a meal: at or below 120 mg/dL (6.7 mmol/L).  A1c (hemoglobin A1c) level: 6-6.5%.  Follow these instructions at home:  Take over-the-counter and prescription medicines only as told by your health care provider.  Manage your weight gain during pregnancy. The amount of weight that you are expected to gain depends on your pre-pregnancy BMI (body mass index).  Keep all follow-up visits as told by your health care provider. This is important. Consider asking your health care provider these questions:   Do I need to meet with a diabetes educator?  Where can I find a support group for people with diabetes?  What equipment will I need to manage my diabetes at home?  What diabetes medicines do I need, and when should I take them?  How often do I need to check my blood glucose?  What number can I call if I have questions?  When is my next appointment? Where to find more information:  For more information about diabetes, visit: ? American Diabetes Association (ADA): www.diabetes.org ? American Association of Diabetes Educators (AADE): www.diabeteseducator.org/patient-resources Contact a health care provider if:  Your blood glucose level is at or above 240 mg/dL (13.3 mmol/L).  Your blood glucose level is at or above 200 mg/dL (11.1 mmol/L) and you have ketones in your urine.  You have been sick or have had a fever for 2 days or more and you are not getting better.  You have any of the following problems for more than 6 hours: ? You cannot eat or drink. ? You have nausea and vomiting. ? You have diarrhea. Get help right away if:  Your blood glucose is below 54  mg/dL (3 mmol/L).  You become confused or you have trouble thinking clearly.  You have difficulty breathing.  You have moderate or large ketone levels in your urine.  Your baby is moving around less than usual.  You develop unusual discharge or bleeding from your vagina.  You start having contractions early (prematurely). Contractions may feel like a tightening in your lower abdomen. This information is not intended to replace advice given to you by your health care provider. Make sure you discuss any questions you have with your health care provider. Document Released: 09/20/2000 Document Revised: 11/20/2015 Document Reviewed: 07/18/2015 Elsevier Interactive Patient Education  2017 Elsevier Inc.  

## 2016-12-06 NOTE — Progress Notes (Signed)
Diabetes Education scheduled with MFM on June 14th @ 1600.  Pt notified.

## 2016-12-06 NOTE — Progress Notes (Signed)
   PRENATAL VISIT NOTE  Subjective:  Alison Stone is a 31 y.o. [phone] at 5463w3d being seen today for ongoing prenatal care.  She is currently monitored for the following issues for this high-risk pregnancy and has UTI (urinary tract infection); Supervision of high risk pregnancy, antepartum, third trimester; Iron deficiency anemia during pregnancy; and Gestational diabetes on her problem list.  Patient reports no complaints.  Contractions: Not present. Vag. Bleeding: None.  Movement: Present. Denies leaking of fluid.   The following portions of the patient's history were reviewed and updated as appropriate: allergies, current medications, past family history, past medical history, past social history, past surgical history and problem list. Problem list updated.  New Dx GDM.   Objective:   Vitals:   12/06/16 1034  BP: 120/79  Pulse: 81  Weight: 147 lb 3.2 oz (66.8 kg)    Fetal Status: Fetal Heart Rate (bpm): 147   Movement: Present     General:  Alert, oriented and cooperative. Patient is in no acute distress.  Skin: Skin is warm and dry. No rash noted.   Cardiovascular: Normal heart rate noted  Respiratory: Normal respiratory effort, no problems with respiration noted  Abdomen: Soft, gravid, appropriate for gestational age. Pain/Pressure: Present     Pelvic:  Cervical exam deferred        Extremities: Normal range of motion.  Edema: None  Mental Status: Normal mood and affect. Normal behavior. Normal judgment and thought content.   Random CBG 111  Assessment and Plan:  Pregnancy: [id number] at 6263w3d  1. Supervision of high risk pregnancy, antepartum, third trimester   2. Gestational diabetes mellitus, class A1 - See DM educator ASAP to start testing.  - Random CBG - Discussed diabetes diet and risk of uncontrolled GDM.   3. Gestational diabetes mellitus (GDM) in third trimester, gestational diabetes method of control unspecified  Preterm labor symptoms and general  obstetric precautions including but not limited to vaginal bleeding, contractions, leaking of fluid and fetal movement were reviewed in detail with the patient. Please refer to After Visit Summary for other counseling recommendations.  Return in about 3 days (around 12/09/2016) for DM educator.  [facility]   Dorathy KinsmanVirginia Shaquanta Harkless, CNM

## 2016-12-09 ENCOUNTER — Encounter: Payer: Medicaid Other | Attending: Obstetrics and Gynecology | Admitting: *Deleted

## 2016-12-09 ENCOUNTER — Ambulatory Visit (HOSPITAL_COMMUNITY)
Admission: RE | Admit: 2016-12-09 | Discharge: 2016-12-09 | Disposition: A | Discharge status: Home / Self Care | Payer: Medicaid Other | Source: Ambulatory Visit | Attending: Certified Nurse Midwife | Admitting: Certified Nurse Midwife

## 2016-12-09 DIAGNOSIS — R7309 Other abnormal glucose: Secondary | ICD-10-CM

## 2016-12-09 DIAGNOSIS — O24410 Gestational diabetes mellitus in pregnancy, diet controlled: Secondary | ICD-10-CM | POA: Insufficient documentation

## 2016-12-09 DIAGNOSIS — Z3A00 Weeks of gestation of pregnancy not specified: Secondary | ICD-10-CM | POA: Diagnosis not present

## 2016-12-09 DIAGNOSIS — Z713 Dietary counseling and surveillance: Secondary | ICD-10-CM | POA: Insufficient documentation

## 2016-12-10 ENCOUNTER — Other Ambulatory Visit: Payer: Self-pay

## 2016-12-10 MED ORDER — GLUCOSE BLOOD VI STRP: ORAL_STRIP | 12 refills | Status: DC

## 2016-12-10 MED ORDER — ACCU-CHEK FASTCLIX LANCETS MISC: 1.0000 | Freq: Four times a day (QID) | 12 refills | Status: DC

## 2016-12-10 MED ORDER — ACCU-CHEK AVIVA PLUS W/DEVICE KIT: 1.0000 | PACK | Freq: Four times a day (QID) | 0 refills | Status: DC

## 2016-12-14 NOTE — Progress Notes (Signed)
  Patient was seen on 12/09/2016 for Gestational Diabetes self-management . Patient states no previous history of GDM. The following learning objectives were met by the patient :   States the definition of Gestational Diabetes  States why dietary management is important in controlling blood glucose  Describes the effects of carbohydrates on blood glucose levels  Demonstrates ability to create a balanced meal plan  Demonstrates carbohydrate counting   States when to check blood glucose levels  Demonstrates proper blood glucose monitoring techniques  States the effect of stress and exercise on blood glucose levels  States the importance of limiting caffeine and abstaining from alcohol and smoking  Plan:  Aim for 3 Carb Choices per meal (45 grams) +/- 1 either way  Aim for 1-2 Carbs per snack Begin reading food labels for Total Carbohydrate of foods Consider  increasing your activity level by walking or other activity daily as tolerated Begin checking BG before breakfast and 2 hours after first bite of breakfast, lunch and dinner as directed by MD  Bring Log Book to every medical appointment   Take medication if directed by MD  Blood glucose monitor Rx called into pharmacy:  Accu Check Guide with Fast Clix drums Patient instructed to test pre breakfast and 2 hours each meal as directed by MD  Patient instructed to monitor glucose levels: FBS: 60 - 95 mg/dl 2 hour: <120 mg/dl  Patient received the following handouts:   Nutrition Diabetes and Pregnancy  Carbohydrate Counting List  Patient will be seen for follow-up as needed.

## 2016-12-15 ENCOUNTER — Encounter: Payer: Self-pay | Admitting: *Deleted

## 2016-12-16 ENCOUNTER — Encounter: Payer: Self-pay | Admitting: Advanced Practice Midwife

## 2016-12-16 ENCOUNTER — Ambulatory Visit (INDEPENDENT_AMBULATORY_CARE_PROVIDER_SITE_OTHER): Payer: Medicaid Other | Admitting: Advanced Practice Midwife

## 2016-12-16 DIAGNOSIS — O24410 Gestational diabetes mellitus in pregnancy, diet controlled: Secondary | ICD-10-CM

## 2016-12-16 LAB — GLUCOSE, CAPILLARY: GLUCOSE-CAPILLARY: 114 mg/dL — AB (ref 65–99)

## 2016-12-16 MED ORDER — ACCU-CHEK AVIVA PLUS W/DEVICE KIT: 1.0000 | PACK | Freq: Four times a day (QID) | 0 refills | Status: DC

## 2016-12-16 MED ORDER — ACCU-CHEK FASTCLIX LANCETS MISC: 1.0000 | Freq: Four times a day (QID) | 12 refills | Status: DC

## 2016-12-16 NOTE — Progress Notes (Signed)
    PRENATAL VISIT NOTE  Subjective:  Alison Stone is a 31 y.o. [phone] at 74w6dbeing seen today for ongoing prenatal care.  She is currently monitored for the following issues for this high-risk pregnancy and has UTI (urinary tract infection); Supervision of high risk pregnancy, antepartum, third trimester; Iron deficiency anemia during pregnancy; and Gestational diabetes on her problem list.  Patient reports that she has not yet picked up her blood glucose meter and test strips from the pharmacy, but states that she has been strictly controlling her diet.  She expressed concern that her baby is going to be large and states she is bigger than she was with her previous births.   Patient reports one contraction last week but has not experienced any others. She also experiences headaches occasionally that are well controlled with Tylenol.  Vag. Bleeding: None.  Movement: Present. Denies leaking of fluid or blood. Denies visual changes.   The following portions of the patient's history were reviewed and updated as appropriate: allergies, current medications, past family history, past medical history, past social history, past surgical history and problem list. Problem list updated.  Objective:   Vitals:   12/16/16 0753  BP: 116/64  Pulse: (!) 109  Weight: 147 lb 6.4 oz (66.9 kg)    Fetal Status: Fetal Heart Rate (bpm): 165   Movement: Present     General:  Alert, oriented and cooperative. Patient is in no acute distress.  Skin: Skin is warm and dry. No rash noted.   Cardiovascular: Normal heart rate noted  Respiratory: Normal respiratory effort, no problems with respiration noted  Abdomen: Soft, gravid, appropriate for gestational age. Pain/Pressure: Present     Pelvic:  Cervical exam deferred        Extremities: Normal range of motion.  Edema: None  Mental Status: Normal mood and affect. Normal behavior. Normal judgment and thought content.   Fundus Height: 37 cm  Assessment  and Plan:  Pregnancy: [id number] 323w6d1. Diet controlled gestational diabetes mellitus (GDM) in third trimester Patient was given paper prescriptions for the glucose monitor and test strips so she could begin checking her glucose 2 hours after each meal and first thing in the morning.  She was educated on gestational diabetes and the fetal risks associated with the disease including large birth weight and fetal lung development problems.  She was told to keep a log to take notes on which foods cause drive her sugars high.  She was told to call the office if she notices her glucose is high more than half of the time for further evaluation.    - ACCU-CHEK FASTCLIX LANCETS MISC; 1 each by Percutaneous route 4 (four) times daily.  Dispense: 100 each; Refill: 12 - Blood Glucose Monitoring Suppl (ACCU-CHEK AVIVA PLUS) w/Device KIT; 1 Device by Does not apply route 4 (four) times daily.  Dispense: 1 kit; Refill: 0  Preterm labor symptoms and general obstetric precautions including but not limited to vaginal bleeding, contractions, leaking of fluid and fetal movement were reviewed in detail with the patient. Please refer to After Visit Summary for other counseling recommendations.  Return in about 1 week (around 12/23/2016) for [facility]raception handout given, not interested in tubal ligation  MaJanace HoardStudent-PA

## 2016-12-16 NOTE — Progress Notes (Signed)
  I was present for the exam and agree with above. Paper Rx diabetes testing summarized and strongly urged patient to start testing as soon as possible. Instructed her to call see [facility] after 2 or 3 days more than 50% of her blood sugars are out of range so that we may start medication as soon as possible. Lengthy discussion about importance of testing blood sugars, bringing log book and keeping appointments. Discussed risks of uncontrolled DM in pregnancy including delayed fetal lung maturity, IUFD and shoulder dystocia possibly resulting brachial plexus palsy, brain damage, intrapartum death and extensive obstetric lacerations. Patient verbalizes understanding.    Katrinka BlazingSmith, [place], CNM 12/19/2016 11:11 AM

## 2016-12-16 NOTE — Patient Instructions (Signed)
Gestational Diabetes Mellitus, Diagnosis Gestational diabetes (gestational diabetes mellitus) is a temporary form of diabetes that some women develop during pregnancy. It usually occurs around weeks 24-28 of pregnancy and goes away after delivery. Hormonal changes during pregnancy can interfere with insulin production and function, which may result in one or both of these problems:  The pancreas does not make enough of a hormone called insulin.  Cells in the body do not respond properly to insulin that the body makes (insulin resistance).  Normally, insulin allows sugars (glucose) to enter cells in the body. The cells use glucose for energy. Insulin resistance or lack of insulin causes excess glucose to build up in the blood instead of going into cells. As a result, high blood glucose (hyperglycemia) develops. What are the risks? If gestational diabetes is treated, it is unlikely to cause problems. If it is not controlled with treatment, it may cause problems during labor and delivery, and some of those problems can be harmful to the unborn baby (fetus) and the mother. Uncontrolled gestational diabetes may also cause the newborn baby to have breathing problems and low blood glucose. Women who get gestational diabetes are more likely to develop it if they get pregnant again, and they are more likely to develop type 2 diabetes in the future. What increases the risk? This condition may be more likely to develop in pregnant women who:  Are older than age 25 during pregnancy.  Have a family history of diabetes.  Are overweight.  Had gestational diabetes in the past.  Have polycystic ovarian syndrome (PCOS).  Are pregnant with twins or multiples.  Are of American-Indian, African-American, Hispanic/Latino, or Asian/Pacific Islander descent.  What are the signs or symptoms? Most women do not notice symptoms of gestational diabetes because the symptoms are similar to normal symptoms of pregnancy.  Symptoms of gestational diabetes may include:  Increased thirst (polydipsia).  Increased hunger(polyphagia).  Increased urination (polyuria).  How is this diagnosed?  This condition may be diagnosed based on your blood glucose level, which may be checked with one or more of the following blood tests:  A fasting blood glucose (FBG) test. You will not be allowed to eat (you will fast) for at least 8 hours before a blood sample is taken.  A random blood glucose test. This checks your blood glucose at any time of day regardless of when you ate.  An oral glucose tolerance test (OGTT). This is usually done during weeks 24-28 of pregnancy. ? For this test, you will have an FBG test done. Then, you will drink a beverage that contains glucose. Your blood glucose will be tested again 1 hour after drinking the glucose beverage (1-hour OGTT). ? If the 1-hour OGTT result is at or above 140 mg/dL (7.8 mmol/L), you will repeat the OGTT. This time, your blood glucose will be tested 3 hours after drinking the glucose beverage (3-hour OGTT).  If you have risk factors, you may be screened for undiagnosed type 2 diabetes at your first health care visit during your pregnancy (prenatal visit). How is this treated?  Your treatment may be managed by a specialist called an endocrinologist. This condition is treated by following instructions from your health care provider about:  Eating a healthier diet and getting more physical activity. These changes are the most important ways to manage gestational diabetes.  Checking your blood glucose. Do this as often as told.  Taking diabetes medicines or insulin every day. These will only be prescribed if they are   needed. ? If you use insulin, you may need to adjust your dosage based on how physically active you are and what foods you eat. Your health care provider will tell you how to do this.  Your health care provider will set treatment goals for you based on the  stage of your pregnancy and any other medical conditions you have. Generally, the goal of treatment is to maintain the following blood glucose levels during pregnancy:  Fasting: at or below 95 mg/dL (5.3 mmol/L).  After meals (postprandial): ? One hour after a meal: at or below 140 mg/dL (7.8 mmol/L). ? Two hours after a meal: at or below 120 mg/dL (6.7 mmol/L).  A1c (hemoglobin A1c) level: 6-6.5%.  Follow these instructions at home:  Take over-the-counter and prescription medicines only as told by your health care provider.  Manage your weight gain during pregnancy. The amount of weight that you are expected to gain depends on your pre-pregnancy BMI (body mass index).  Keep all follow-up visits as told by your health care provider. This is important. Consider asking your health care provider these questions:   Do I need to meet with a diabetes educator?  Where can I find a support group for people with diabetes?  What equipment will I need to manage my diabetes at home?  What diabetes medicines do I need, and when should I take them?  How often do I need to check my blood glucose?  What number can I call if I have questions?  When is my next appointment? Where to find more information:  For more information about diabetes, visit: ? American Diabetes Association (ADA): www.diabetes.org ? American Association of Diabetes Educators (AADE): www.diabeteseducator.org/patient-resources Contact a health care provider if:  Your blood glucose level is at or above 240 mg/dL (13.3 mmol/L).  Your blood glucose level is at or above 200 mg/dL (11.1 mmol/L) and you have ketones in your urine.  You have been sick or have had a fever for 2 days or more and you are not getting better.  You have any of the following problems for more than 6 hours: ? You cannot eat or drink. ? You have nausea and vomiting. ? You have diarrhea. Get help right away if:  Your blood glucose is below 54  mg/dL (3 mmol/L).  You become confused or you have trouble thinking clearly.  You have difficulty breathing.  You have moderate or large ketone levels in your urine.  Your baby is moving around less than usual.  You develop unusual discharge or bleeding from your vagina.  You start having contractions early (prematurely). Contractions may feel like a tightening in your lower abdomen. This information is not intended to replace advice given to you by your health care provider. Make sure you discuss any questions you have with your health care provider. Document Released: 09/20/2000 Document Revised: 11/20/2015 Document Reviewed: 07/18/2015 Elsevier Interactive Patient Education  2017 Elsevier Inc.  

## 2016-12-24 ENCOUNTER — Other Ambulatory Visit (HOSPITAL_COMMUNITY)
Admission: RE | Admit: 2016-12-24 | Discharge: 2016-12-24 | Disposition: A | Discharge status: Home / Self Care | Payer: Medicaid Other | Source: Ambulatory Visit | Attending: Obstetrics & Gynecology | Admitting: Obstetrics & Gynecology

## 2016-12-24 ENCOUNTER — Ambulatory Visit (INDEPENDENT_AMBULATORY_CARE_PROVIDER_SITE_OTHER): Payer: Medicaid Other | Admitting: Obstetrics & Gynecology

## 2016-12-24 VITALS — BP 108/71 | HR 116 | Wt 149.9 lb

## 2016-12-24 DIAGNOSIS — Z3A00 Weeks of gestation of pregnancy not specified: Secondary | ICD-10-CM | POA: Insufficient documentation

## 2016-12-24 DIAGNOSIS — O0993 Supervision of high risk pregnancy, unspecified, third trimester: Secondary | ICD-10-CM | POA: Diagnosis present

## 2016-12-24 DIAGNOSIS — O24410 Gestational diabetes mellitus in pregnancy, diet controlled: Secondary | ICD-10-CM

## 2016-12-24 LAB — OB RESULTS CONSOLE GBS: GBS: NEGATIVE

## 2016-12-24 MED ORDER — GLUCOSE BLOOD VI STRP: ORAL_STRIP | 12 refills | Status: DC

## 2016-12-24 NOTE — Patient Instructions (Signed)
Return to clinic for any scheduled appointments or obstetric concerns, or go to [facility] for evaluation  

## 2016-12-24 NOTE — Progress Notes (Signed)
   PRENATAL VISIT NOTE  Subjective:  Alison Stone is a 31 y.o. [phone] at 3667w0d being seen today for ongoing prenatal care.  She is currently monitored for the following issues for this high-risk pregnancy and has UTI (urinary tract infection); Supervision of high risk pregnancy, antepartum, third trimester; Iron deficiency anemia during pregnancy; and Gestational diabetes on her problem list.  Patient reports no complaints.  Contractions: Irregular. Vag. Bleeding: None.  Movement: Present. Denies leaking of fluid.   The following portions of the patient's history were reviewed and updated as appropriate: allergies, current medications, past family history, past medical history, past social history, past surgical history and problem list. Problem list updated.  Objective:   Vitals:   12/24/16 0817  BP: 108/71  Pulse: (!) 116  Weight: 149 lb 14.4 oz (68 kg)    Fetal Status: Fetal Heart Rate (bpm): 153 Fundal Height: 39 cm Movement: Present  Presentation: Vertex  General:  Alert, oriented and cooperative. Patient is in no acute distress.  Skin: Skin is warm and dry. No rash noted.   Cardiovascular: Normal heart rate noted  Respiratory: Normal respiratory effort, no problems with respiration noted  Abdomen: Soft, gravid, appropriate for gestational age. Pain/Pressure: Absent     Pelvic:  Cervical exam performed Dilation: 1 Effacement (%): Thick Station: Ballotable  Extremities: Normal range of motion.  Edema: None  Mental Status: Normal mood and affect. Normal behavior. Normal judgment and thought content.   Assessment and Plan:  Pregnancy: [id number] at 4567w0d  1. Diet controlled gestational diabetes mellitus (GDM) in third trimester Unable to check blood sugars as she did not have strips yet.  Growth scan ordered.  - glucose blood test strip; Use to check blood sugars four times as instructed  Dispense: 100 each; Refill: 12 - US MFM OB FOLLOW UP; Future  2. Supervision of high  risk pregnancy, antepartum, third trimester Pelvic cultures done today. - Strep Gp B NAA - [facility]Chlamydia [facility]  Preterm labor symptoms and general obstetric precautions including but not limited to vaginal bleeding, contractions, leaking of fluid and fetal movement were reviewed in detail with the patient. Please refer to After Visit Summary for other counseling recommendations.  Return in about 1 week (around 12/31/2016) for OB Visit (HOB).   Jaynie CollinsUgonna Khyran Riera, MD

## 2016-12-26 LAB — STREP GP B NAA: STREP GROUP B AG: NEGATIVE

## 2016-12-27 LAB — GC/CHLAMYDIA PROBE AMP (~~LOC~~) NOT AT ARMC
Chlamydia: NEGATIVE
Neisseria Gonorrhea: NEGATIVE

## 2016-12-30 ENCOUNTER — Ambulatory Visit (INDEPENDENT_AMBULATORY_CARE_PROVIDER_SITE_OTHER): Payer: Medicaid Other | Admitting: Advanced Practice Midwife

## 2016-12-30 DIAGNOSIS — O0993 Supervision of high risk pregnancy, unspecified, third trimester: Secondary | ICD-10-CM

## 2016-12-30 LAB — POCT URINALYSIS DIP (DEVICE)
Bilirubin Urine: NEGATIVE
Glucose, UA: NEGATIVE mg/dL
HGB URINE DIPSTICK: NEGATIVE
Ketones, ur: NEGATIVE mg/dL
LEUKOCYTES UA: NEGATIVE
NITRITE: NEGATIVE
PH: 7 (ref 5.0–8.0)
PROTEIN: NEGATIVE mg/dL
SPECIFIC GRAVITY, URINE: 1.02 (ref 1.005–1.030)
UROBILINOGEN UA: 1 mg/dL (ref 0.0–1.0)

## 2016-12-30 NOTE — Patient Instructions (Signed)
Third Trimester of Pregnancy The third trimester is from week 28 through week 40 (months 7 through 9). The third trimester is a time when the unborn baby (fetus) is growing rapidly. At the end of the ninth month, the fetus is about 20 inches in length and weighs 6-10 pounds. Body changes during your third trimester Your body will continue to go through many changes during pregnancy. The changes vary from woman to woman. During the third trimester:  Your weight will continue to increase. You can expect to gain 25-35 pounds (11-16 kg) by the end of the pregnancy.  You may begin to get stretch marks on your hips, abdomen, and breasts.  You may urinate more often because the fetus is moving lower into your pelvis and pressing on your bladder.  You may develop or continue to have heartburn. This is caused by increased hormones that slow down muscles in the digestive tract.  You may develop or continue to have constipation because increased hormones slow digestion and cause the muscles that push waste through your intestines to relax.  You may develop hemorrhoids. These are swollen veins (varicose veins) in the rectum that can itch or be painful.  You may develop swollen, bulging veins (varicose veins) in your legs.  You may have increased body aches in the pelvis, back, or thighs. This is due to weight gain and increased hormones that are relaxing your joints.  You may have changes in your hair. These can include thickening of your hair, rapid growth, and changes in texture. Some women also have hair loss during or after pregnancy, or hair that feels dry or thin. Your hair will most likely return to normal after your baby is born.  Your breasts will continue to grow and they will continue to become tender. A yellow fluid (colostrum) may leak from your breasts. This is the first milk you are producing for your baby.  Your belly button may stick out.  You may notice more swelling in your hands,  face, or ankles.  You may have increased tingling or numbness in your hands, arms, and legs. The skin on your belly may also feel numb.  You may feel short of breath because of your expanding uterus.  You may have more problems sleeping. This can be caused by the size of your belly, increased need to urinate, and an increase in your body's metabolism.  You may notice the fetus "dropping," or moving lower in your abdomen (lightening).  You may have increased vaginal discharge.  You may notice your joints feel loose and you may have pain around your pelvic bone.  What to expect at prenatal visits You will have prenatal exams every 2 weeks until week 36. Then you will have weekly prenatal exams. During a routine prenatal visit:  You will be weighed to make sure you and the baby are growing normally.  Your blood pressure will be taken.  Your abdomen will be measured to track your baby's growth.  The fetal heartbeat will be listened to.  Any test results from the previous visit will be discussed.  You may have a cervical check near your due date to see if your cervix has softened or thinned (effaced).  You will be tested for Group B streptococcus. This happens between 35 and 37 weeks.  Your health care provider may ask you:  What your birth plan is.  How you are feeling.  If you are feeling the baby move.  If you have had   any abnormal symptoms, such as leaking fluid, bleeding, severe headaches, or abdominal cramping.  If you are using any tobacco products, including cigarettes, chewing tobacco, and electronic cigarettes.  If you have any questions.  Other tests or screenings that may be performed during your third trimester include:  Blood tests that check for low iron levels (anemia).  Fetal testing to check the health, activity level, and growth of the fetus. Testing is done if you have certain medical conditions or if there are problems during the  pregnancy.  Nonstress test (NST). This test checks the health of your baby to make sure there are no signs of problems, such as the baby not getting enough oxygen. During this test, a belt is placed around your belly. The baby is made to move, and its heart rate is monitored during movement.  What is false labor? False labor is a condition in which you feel small, irregular tightenings of the muscles in the womb (contractions) that usually go away with rest, changing position, or drinking water. These are called Braxton Hicks contractions. Contractions may last for hours, days, or even weeks before true labor sets in. If contractions come at regular intervals, become more frequent, increase in intensity, or become painful, you should see your health care provider. What are the signs of labor?  Abdominal cramps.  Regular contractions that start at 10 minutes apart and become stronger and more frequent with time.  Contractions that start on the top of the uterus and spread down to the lower abdomen and back.  Increased pelvic pressure and dull back pain.  A watery or bloody mucus discharge that comes from the vagina.  Leaking of amniotic fluid. This is also known as your "water breaking." It could be a slow trickle or a gush. Let your health care provider know if it has a color or strange odor. If you have any of these signs, call your health care provider right away, even if it is before your due date. Follow these instructions at home: Medicines  Follow your health care provider's instructions regarding medicine use. Specific medicines may be either safe or unsafe to take during pregnancy.  Take a prenatal vitamin that contains at least 600 micrograms (mcg) of folic acid.  If you develop constipation, try taking a stool softener if your health care provider approves. Eating and drinking  Eat a balanced diet that includes fresh fruits and vegetables, whole grains, good sources of protein  such as meat, eggs, or tofu, and low-fat dairy. Your health care provider will help you determine the amount of weight gain that is right for you.  Avoid raw meat and uncooked cheese. These carry germs that can cause birth defects in the baby.  If you have low calcium intake from food, talk to your health care provider about whether you should take a daily calcium supplement.  Eat four or five small meals rather than three large meals a day.  Limit foods that are high in fat and processed sugars, such as fried and sweet foods.  To prevent constipation: ? Drink enough fluid to keep your urine clear or pale yellow. ? Eat foods that are high in fiber, such as fresh fruits and vegetables, whole grains, and beans. Activity  Exercise only as directed by your health care provider. Most women can continue their usual exercise routine during pregnancy. Try to exercise for 30 minutes at least 5 days a week. Stop exercising if you experience uterine contractions.  Avoid heavy   lifting.  Do not exercise in extreme heat or humidity, or at high altitudes.  Wear low-heel, comfortable shoes.  Practice good posture.  You may continue to have sex unless your health care provider tells you otherwise. Relieving pain and discomfort  Take frequent breaks and rest with your legs elevated if you have leg cramps or low back pain.  Take warm sitz baths to soothe any pain or discomfort caused by hemorrhoids. Use hemorrhoid cream if your health care provider approves.  Wear a good support bra to prevent discomfort from breast tenderness.  If you develop varicose veins: ? Wear support pantyhose or compression stockings as told by your healthcare provider. ? Elevate your feet for 15 minutes, 3-4 times a day. Prenatal care  Write down your questions. Take them to your prenatal visits.  Keep all your prenatal visits as told by your health care provider. This is important. Safety  Wear your seat belt at  all times when driving.  Make a list of emergency phone numbers, including numbers for family, friends, the hospital, and police and fire departments. General instructions  Avoid cat litter boxes and soil used by cats. These carry germs that can cause birth defects in the baby. If you have a cat, ask someone to clean the litter box for you.  Do not travel far distances unless it is absolutely necessary and only with the approval of your health care provider.  Do not use hot tubs, steam rooms, or saunas.  Do not drink alcohol.  Do not use any products that contain nicotine or tobacco, such as cigarettes and e-cigarettes. If you need help quitting, ask your health care provider.  Do not use any medicinal herbs or unprescribed drugs. These chemicals affect the formation and growth of the baby.  Do not douche or use tampons or scented sanitary pads.  Do not cross your legs for long periods of time.  To prepare for the arrival of your baby: ? Take prenatal classes to understand, practice, and ask questions about labor and delivery. ? Make a trial run to the hospital. ? Visit the hospital and tour the maternity area. ? Arrange for maternity or paternity leave through employers. ? Arrange for family and friends to take care of pets while you are in the hospital. ? Purchase a rear-facing car seat and make sure you know how to install it in your car. ? Pack your hospital bag. ? Prepare the baby's nursery. Make sure to remove all pillows and stuffed animals from the baby's crib to prevent suffocation.  Visit your dentist if you have not gone during your pregnancy. Use a soft toothbrush to brush your teeth and be gentle when you floss. Contact a health care provider if:  You are unsure if you are in labor or if your water has broken.  You become dizzy.  You have mild pelvic cramps, pelvic pressure, or nagging pain in your abdominal area.  You have lower back pain.  You have persistent  nausea, vomiting, or diarrhea.  You have an unusual or bad smelling vaginal discharge.  You have pain when you urinate. Get help right away if:  Your water breaks before 37 weeks.  You have regular contractions less than 5 minutes apart before 37 weeks.  You have a fever.  You are leaking fluid from your vagina.  You have spotting or bleeding from your vagina.  You have severe abdominal pain or cramping.  You have rapid weight loss or weight gain.    You have shortness of breath with chest pain.  You notice sudden or extreme swelling of your face, hands, ankles, feet, or legs.  Your baby makes fewer than 10 movements in 2 hours.  You have severe headaches that do not go away when you take medicine.  You have vision changes. Summary  The third trimester is from week 28 through week 40, months 7 through 9. The third trimester is a time when the unborn baby (fetus) is growing rapidly.  During the third trimester, your discomfort may increase as you and your baby continue to gain weight. You may have abdominal, leg, and back pain, sleeping problems, and an increased need to urinate.  During the third trimester your breasts will keep growing and they will continue to become tender. A yellow fluid (colostrum) may leak from your breasts. This is the first milk you are producing for your baby.  False labor is a condition in which you feel small, irregular tightenings of the muscles in the womb (contractions) that eventually go away. These are called Braxton Hicks contractions. Contractions may last for hours, days, or even weeks before true labor sets in.  Signs of labor can include: abdominal cramps; regular contractions that start at 10 minutes apart and become stronger and more frequent with time; watery or bloody mucus discharge that comes from the vagina; increased pelvic pressure and dull back pain; and leaking of amniotic fluid. This information is not intended to replace advice  given to you by your health care provider. Make sure you discuss any questions you have with your health care provider. Document Released: 06/08/2001 Document Revised: 11/20/2015 Document Reviewed: 08/15/2012 Elsevier Interactive Patient Education  2017 [employer]  

## 2016-12-30 NOTE — Progress Notes (Signed)
Subjective:    Alison Stone is a 31 y.o. female being seen today for her obstetrical visit. She is at 4720w6d gestation. Patient reports no complaints. Fetal movement: normal.  Review of Systems:   Review of Systems  Genitourinary: Negative for pelvic pain, vaginal bleeding and vaginal discharge.    Blood sugar log reviewed: fasting: 60-94 2 hour PP: 103-140 2/9 were 140.   Objective:    BP 117/73   Pulse (!) 102   Wt 152 lb 12.8 oz (69.3 kg)   LMP 04/28/2016 (Approximate)   BMI 29.84 kg/m   Physical Exam  Nursing note and vitals reviewed. Constitutional: She is oriented to person, place, and time. She appears well-developed and well-nourished. No distress.  Cardiovascular: Normal rate.   Respiratory: Effort normal.  GI: Soft. There is no tenderness.  Neurological: She is alert and oriented to person, place, and time.  Skin: Skin is warm and dry.  Psychiatric: She has a normal mood and affect.    Exam  FHT:  140 BPM  Uterine Size: 36  cm  Presentation: vtx     Assessment:    Pregnancy:  [id number]    Plan:    Patient Active Problem List   Diagnosis Date Noted  . Gestational diabetes 12/06/2016  . Iron deficiency anemia during pregnancy 10/13/2016  . Supervision of high risk pregnancy, antepartum, third trimester 10/11/2016    Infant feeding: plans to breastfeed. Follow up in 1 Week.    Thressa ShellerHeather Hogan 8:19 AM 12/30/16

## 2017-01-07 ENCOUNTER — Ambulatory Visit (HOSPITAL_COMMUNITY)
Admission: RE | Admit: 2017-01-07 | Discharge: 2017-01-07 | Disposition: A | Discharge status: Home / Self Care | Payer: Medicaid Other | Source: Ambulatory Visit | Attending: Obstetrics & Gynecology | Admitting: Obstetrics & Gynecology

## 2017-01-07 ENCOUNTER — Encounter (HOSPITAL_COMMUNITY): Payer: Self-pay

## 2017-01-07 ENCOUNTER — Ambulatory Visit (INDEPENDENT_AMBULATORY_CARE_PROVIDER_SITE_OTHER): Payer: Medicaid Other | Admitting: Obstetrics & Gynecology

## 2017-01-07 VITALS — BP 116/81 | HR 104 | Wt 155.6 lb

## 2017-01-07 DIAGNOSIS — O24410 Gestational diabetes mellitus in pregnancy, diet controlled: Secondary | ICD-10-CM | POA: Insufficient documentation

## 2017-01-07 DIAGNOSIS — O0933 Supervision of pregnancy with insufficient antenatal care, third trimester: Secondary | ICD-10-CM | POA: Insufficient documentation

## 2017-01-07 DIAGNOSIS — O0993 Supervision of high risk pregnancy, unspecified, third trimester: Secondary | ICD-10-CM

## 2017-01-07 DIAGNOSIS — D509 Iron deficiency anemia, unspecified: Secondary | ICD-10-CM

## 2017-01-07 DIAGNOSIS — Z3A18 18 weeks gestation of pregnancy: Secondary | ICD-10-CM | POA: Diagnosis not present

## 2017-01-07 DIAGNOSIS — O99019 Anemia complicating pregnancy, unspecified trimester: Secondary | ICD-10-CM

## 2017-01-07 NOTE — Progress Notes (Signed)
   PRENATAL VISIT NOTE  Subjective:  Alison Stone is a 31 y.o. [phone] [id number] at 3090w0d being seen today for ongoing prenatal care.  She is currently monitored for the following issues for this high-risk pregnancy and has Supervision of high risk pregnancy, antepartum, third trimester; Iron deficiency anemia during pregnancy; and Gestational diabetes on her problem list.  Patient reports occasional contractions.  Contractions: Not present. Vag. Bleeding: None.  Movement: Present. Denies leaking of fluid.   The following portions of the patient's history were reviewed and updated as appropriate: allergies, current medications, past family history, past medical history, past social history, past surgical history and problem list. Problem list updated.  Objective:   Vitals:   01/07/17 0814  BP: 116/81  Pulse: (!) 104  Weight: 155 lb 9.6 oz (70.6 kg)    Fetal Status: Fetal Heart Rate (bpm): 150 Fundal Height: 39 cm Movement: Present  Presentation: Vertex  General:  Alert, oriented and cooperative. Patient is in no acute distress.  Skin: Skin is warm and dry. No rash noted.   Cardiovascular: Normal heart rate noted  Respiratory: Normal respiratory effort, no problems with respiration noted  Abdomen: Soft, gravid, appropriate for gestational age. Pain/Pressure: Present     Pelvic:  Cervical exam performed Dilation: 1 Effacement (%): Thick Station: Ballotable  Extremities: Normal range of motion.  Edema: None  Mental Status: Normal mood and affect. Normal behavior. Normal judgment and thought content.   Assessment and Plan:  Pregnancy: [id number] at 5890w0d  1. Diet controlled gestational diabetes mellitus (GDM) in third trimester Blood sugars mostly within range, elevated PP dinner in 130s-140s. Emphasized diet and exercise adherence. No need for medication at this patient.  Growth scan today, will follow up results and manage accordingly.  IOL to be scheduled at 40 weeks.  2. Supervision of  high risk pregnancy, antepartum, third trimester Term labor symptoms and general obstetric precautions including but not limited to vaginal bleeding, contractions, leaking of fluid and fetal movement were reviewed in detail with the patient.   Please refer to After Visit Summary for other counseling recommendations.  Return in about 1 week (around 01/14/2017) for OB Visit (HOB).   Jaynie CollinsUgonna Sahira Cataldi, MD

## 2017-01-07 NOTE — Progress Notes (Signed)
Patient complains she's worried about the acne on her face, wondering what she can use.Patient tried Neutrogena but doesn't seem to be working.

## 2017-01-12 ENCOUNTER — Telehealth (HOSPITAL_COMMUNITY): Payer: Self-pay | Admitting: *Deleted

## 2017-01-12 ENCOUNTER — Other Ambulatory Visit: Payer: Self-pay | Admitting: Advanced Practice Midwife

## 2017-01-12 NOTE — Telephone Encounter (Signed)
Preadmission screen  

## 2017-01-14 ENCOUNTER — Encounter: Payer: Self-pay | Admitting: Obstetrics & Gynecology

## 2017-01-14 ENCOUNTER — Ambulatory Visit (INDEPENDENT_AMBULATORY_CARE_PROVIDER_SITE_OTHER): Payer: Medicaid Other | Admitting: Obstetrics & Gynecology

## 2017-01-14 VITALS — BP 110/71 | HR 100 | Wt 159.2 lb

## 2017-01-14 DIAGNOSIS — O24410 Gestational diabetes mellitus in pregnancy, diet controlled: Secondary | ICD-10-CM

## 2017-01-14 DIAGNOSIS — O0993 Supervision of high risk pregnancy, unspecified, third trimester: Secondary | ICD-10-CM

## 2017-01-14 NOTE — Patient Instructions (Signed)
Labor Induction Labor induction is when steps are taken to cause a pregnant woman to begin the labor process. Most women go into labor on their own between 37 weeks and 42 weeks of the pregnancy. When this does not happen or when there is a medical need, methods may be used to induce labor. Labor induction causes a pregnant woman's uterus to contract. It also causes the cervix to soften (ripen), open (dilate), and thin out (efface). Usually, labor is not induced before 39 weeks of the pregnancy unless there is a problem with the baby or mother. Before inducing labor, your health care provider will consider a number of factors, including the following:  The medical condition of you and the baby.  How many weeks along you are.  The status of the baby's lung maturity.  The condition of the cervix.  The position of the baby. What are the reasons for labor induction? Labor may be induced for the following reasons:  The health of the baby or mother is at risk.  The pregnancy is overdue by 1 week or more.  The water breaks but labor does not start on its own.  The mother has a health condition or serious illness, such as high blood pressure, infection, placental abruption, or diabetes.  The amniotic fluid amounts are low around the baby.  The baby is distressed. Convenience or wanting the baby to be born on a certain date is not a reason for inducing labor. What methods are used for labor induction? Several methods of labor induction may be used, such as:  Prostaglandin medicine. This medicine causes the cervix to dilate and ripen. The medicine will also start contractions. It can be taken by mouth or by inserting a suppository into the vagina.  Inserting a thin tube (catheter) with a balloon on the end into the vagina to dilate the cervix. Once inserted, the balloon is expanded with water, which causes the cervix to open.  Stripping the membranes. Your health care provider separates  amniotic sac tissue from the cervix, causing the cervix to be stretched and causing the release of a hormone called progesterone. This may cause the uterus to contract. It is often done during an office visit. You will be sent home to wait for the contractions to begin. You will then come in for an induction.  Breaking the water. Your health care provider makes a hole in the amniotic sac using a small instrument. Once the amniotic sac breaks, contractions should begin. This may still take hours to see an effect.  Medicine to trigger or strengthen contractions. This medicine is given through an IV access tube inserted into a vein in your arm. All of the methods of induction, besides stripping the membranes, will be done in the hospital. Induction is done in the hospital so that you and the baby can be carefully monitored. How long does it take for labor to be induced? Some inductions can take up to 2-3 days. Depending on the cervix, it usually takes less time. It takes longer when you are induced early in the pregnancy or if this is your first pregnancy. If a mother is still pregnant and the induction has been going on for 2-3 days, either the mother will be sent home or a cesarean delivery will be needed. What are the risks associated with labor induction? Some of the risks of induction include:  Changes in fetal heart rate, such as too high, too low, or erratic.  Fetal distress.    Chance of infection for the mother and baby.  Increased chance of having a cesarean delivery.  Breaking off (abruption) of the placenta from the uterus (rare).  Uterine rupture (very rare). When induction is needed for medical reasons, the benefits of induction may outweigh the risks. What are some reasons for not inducing labor? Labor induction should not be done if:  It is shown that your baby does not tolerate labor.  You have had previous surgeries on your uterus, such as a myomectomy or the removal of  fibroids.  Your placenta lies very low in the uterus and blocks the opening of the cervix (placenta previa).  Your baby is not in a head-down position.  The umbilical cord drops down into the birth canal in front of the baby. This could cut off the baby's blood and oxygen supply.  You have had a previous cesarean delivery.  There are unusual circumstances, such as the baby being extremely premature. This information is not intended to replace advice given to you by your health care provider. Make sure you discuss any questions you have with your health care provider. Document Released: 11/03/2006 Document Revised: 11/20/2015 Document Reviewed: 01/11/2013 Elsevier Interactive Patient Education  2017 [employer]  

## 2017-01-14 NOTE — Progress Notes (Signed)
   PRENATAL VISIT NOTE  Subjective:  Alison Stone is a 31 y.o. [phone] at 446w0d being seen today for ongoing prenatal care.  She is currently monitored for the following issues for this high-risk pregnancy and has Supervision of high risk pregnancy, antepartum, third trimester; Iron deficiency anemia during pregnancy; and Gestational diabetes on her problem list.  Patient reports no complaints.  Contractions: Not present. Vag. Bleeding: None.  Movement: Present. Denies leaking of fluid.   The following portions of the patient's history were reviewed and updated as appropriate: allergies, current medications, past family history, past medical history, past social history, past surgical history and problem list. Problem list updated.  Objective:   Vitals:   01/14/17 1033  BP: 110/71  Pulse: 100  Weight: 159 lb 3.2 oz (72.2 kg)    Fetal Status: Fetal Heart Rate (bpm): 140   Movement: Present     General:  Alert, oriented and cooperative. Patient is in no acute distress.  Skin: Skin is warm and dry. No rash noted.   Cardiovascular: Normal heart rate noted  Respiratory: Normal respiratory effort, no problems with respiration noted  Abdomen: Soft, gravid, appropriate for gestational age.  Pain/Pressure: Present     Pelvic: Cervical exam deferred        Extremities: Normal range of motion.  Edema: Trace  Mental Status:  Normal mood and affect. Normal behavior. Normal judgment and thought content.   Assessment and Plan:  Pregnancy: [id number] at 1446w0d  1. Supervision of high risk pregnancy, antepartum, third trimester IOL 40 week  2. Diet controlled gestational diabetes mellitus (GDM) in third trimester Diet control  Term labor symptoms and general obstetric precautions including but not limited to vaginal bleeding, contractions, leaking of fluid and fetal movement were reviewed in detail with the patient. Please refer to After Visit Summary for other counseling recommendations.    Return if symptoms worsen or fail to improve, for pp.   Scheryl DarterJames Bona Hubbard, MD

## 2017-01-20 ENCOUNTER — Ambulatory Visit (INDEPENDENT_AMBULATORY_CARE_PROVIDER_SITE_OTHER): Payer: Medicaid Other | Admitting: Obstetrics and Gynecology

## 2017-01-20 VITALS — BP 126/84 | HR 117 | Wt 159.2 lb

## 2017-01-20 DIAGNOSIS — O0993 Supervision of high risk pregnancy, unspecified, third trimester: Secondary | ICD-10-CM

## 2017-01-20 DIAGNOSIS — O99013 Anemia complicating pregnancy, third trimester: Secondary | ICD-10-CM

## 2017-01-20 DIAGNOSIS — O24410 Gestational diabetes mellitus in pregnancy, diet controlled: Secondary | ICD-10-CM

## 2017-01-20 DIAGNOSIS — O99019 Anemia complicating pregnancy, unspecified trimester: Secondary | ICD-10-CM

## 2017-01-20 DIAGNOSIS — D509 Iron deficiency anemia, unspecified: Secondary | ICD-10-CM

## 2017-01-20 NOTE — Progress Notes (Signed)
Instructed patient to give urine sample while checking patient in. Patient did not leave urine sample.

## 2017-01-20 NOTE — Progress Notes (Signed)
   PRENATAL VISIT NOTE  Subjective:  Alison Stone is a 31 y.o. [phone] at 4158w6d being seen today for ongoing prenatal care.  She is currently monitored for the following issues for this high-risk pregnancy and has Supervision of high risk pregnancy, antepartum, third trimester; Iron deficiency anemia during pregnancy; and Gestational diabetes on her problem list.  Patient reports no complaints.  Contractions: Not present. Vag. Bleeding: None.  Movement: Present. Denies leaking of fluid.   The following portions of the patient's history were reviewed and updated as appropriate: allergies, current medications, past family history, past medical history, past social history, past surgical history and problem list. Problem list updated.  Objective:   Vitals:   01/20/17 0844  BP: 126/84  Pulse: (!) 117  Weight: 159 lb 3.2 oz (72.2 kg)    Fetal Status: Fetal Heart Rate (bpm): 156   Movement: Present  Presentation: Vertex  General:  Alert, oriented and cooperative. Patient is in no acute distress.  Skin: Skin is warm and dry. No rash noted.   Cardiovascular: Normal heart rate noted  Respiratory: Normal respiratory effort, no problems with respiration noted  Abdomen: Soft, gravid, appropriate for gestational age.  Pain/Pressure: Present     Pelvic: Cervical exam performed Dilation: 1.5 Effacement (%): Thick Station: Ballotable  Extremities: Normal range of motion.  Edema: Trace  Mental Status:  Normal mood and affect. Normal behavior. Normal judgment and thought content.   Assessment and Plan:  Pregnancy: [id number] at 2858w6d  1. Supervision of high risk pregnancy, antepartum, third trimester  - Induction scheduled tomorrow 7/27  2. Iron deficiency anemia during pregnancy   3. Diet controlled gestational diabetes mellitus (GDM) in third trimester  Blood sugars mostly in range [place]  60-96, PP dinner continue in 130's-140's. Last growth US 3383 gm @ 78%  Term labor symptoms and  general obstetric precautions including but not limited to vaginal bleeding, contractions, leaking of fluid and fetal movement were reviewed in detail with the patient. Please refer to After Visit Summary for other counseling recommendations.  Return in about 1 day (around 01/21/2017) for For induction of labor .   Kamareon Sciandra, Harolyn RutherfordJennifer I, NP

## 2017-01-20 NOTE — Progress Notes (Deleted)
   PRENATAL VISIT NOTE  Subjective:  Alison Stone is a 31 y.o. [phone] at 5157w6d being seen today for ongoing prenatal care.  She is currently monitored for the following issues for this high-risk pregnancy and has Supervision of high risk pregnancy, antepartum, third trimester; Iron deficiency anemia during pregnancy; and Gestational diabetes on her problem list.  Patient reports no complaints.  Contractions: Not present. Vag. Bleeding: None.  Movement: Present. Denies leaking of fluid.   The following portions of the patient's history were reviewed and updated as appropriate: allergies, current medications, past family history, past medical history, past social history, past surgical history and problem list. Problem list updated.  Objective:   Vitals:   01/20/17 0844  BP: 126/84  Pulse: (!) 117  Weight: 159 lb 3.2 oz (72.2 kg)    Fetal Status: Fetal Heart Rate (bpm): 156   Movement: Present     General:  Alert, oriented and cooperative. Patient is in no acute distress.  Skin: Skin is warm and dry. No rash noted.   Cardiovascular: Normal heart rate noted  Respiratory: Normal respiratory effort, no problems with respiration noted  Abdomen: Soft, gravid, appropriate for gestational age.  Pain/Pressure: Present     Pelvic: {Blank single:19197::"Cervical exam performed","Cervical exam deferred"}        Extremities: Normal range of motion.  Edema: Trace  Mental Status:  Normal mood and affect. Normal behavior. Normal judgment and thought content.   Assessment and Plan:  Pregnancy: A5W0981G6P4014 at 2757w6d  1. Supervision of high risk pregnancy, antepartum, third trimester ***  2. Iron deficiency anemia during pregnancy ***  3. Diet controlled gestational diabetes mellitus (GDM) in third trimester ***  Term labor symptoms and general obstetric precautions including but not limited to vaginal bleeding, contractions, leaking of fluid and fetal movement were reviewed in detail with the  patient. Please refer to After Visit Summary for other counseling recommendations.  No Follow-up on file.   RASCH, Iona HansenJENNIFER IRENE, NP

## 2017-01-21 ENCOUNTER — Encounter (HOSPITAL_COMMUNITY): Payer: Self-pay

## 2017-01-21 ENCOUNTER — Inpatient Hospital Stay (HOSPITAL_COMMUNITY)
Admission: RE | Admit: 2017-01-21 | Discharge: 2017-01-23 | DRG: 775 | Disposition: A | Discharge status: Home / Self Care | Payer: Medicaid Other | Source: Ambulatory Visit | Attending: Obstetrics and Gynecology | Admitting: Obstetrics and Gynecology

## 2017-01-21 DIAGNOSIS — D509 Iron deficiency anemia, unspecified: Secondary | ICD-10-CM | POA: Diagnosis present

## 2017-01-21 DIAGNOSIS — O24429 Gestational diabetes mellitus in childbirth, unspecified control: Secondary | ICD-10-CM | POA: Diagnosis not present

## 2017-01-21 DIAGNOSIS — O24420 Gestational diabetes mellitus in childbirth, diet controlled: Secondary | ICD-10-CM | POA: Diagnosis present

## 2017-01-21 DIAGNOSIS — O9902 Anemia complicating childbirth: Secondary | ICD-10-CM | POA: Diagnosis present

## 2017-01-21 DIAGNOSIS — Z3A40 40 weeks gestation of pregnancy: Secondary | ICD-10-CM

## 2017-01-21 LAB — CBC
HCT: 29.8 % — ABNORMAL LOW (ref 36.0–46.0)
HEMOGLOBIN: 8.6 g/dL — AB (ref 12.0–15.0)
MCH: 20 pg — AB (ref 26.0–34.0)
MCHC: 28.9 g/dL — AB (ref 30.0–36.0)
MCV: 69.5 fL — ABNORMAL LOW (ref 78.0–100.0)
PLATELETS: 276 10*3/uL (ref 150–400)
RBC: 4.29 MIL/uL (ref 3.87–5.11)
RDW: 19.6 % — AB (ref 11.5–15.5)
WBC: 10.5 10*3/uL (ref 4.0–10.5)

## 2017-01-21 LAB — RPR: RPR Ser Ql: NONREACTIVE

## 2017-01-21 LAB — GLUCOSE, CAPILLARY
GLUCOSE-CAPILLARY: 74 mg/dL (ref 65–99)
Glucose-Capillary: 81 mg/dL (ref 65–99)

## 2017-01-21 LAB — TYPE AND SCREEN
ABO/RH(D): AB POS
Antibody Screen: NEGATIVE

## 2017-01-21 LAB — ABO/RH: ABO/RH(D): AB POS

## 2017-01-21 MED ORDER — OXYCODONE-ACETAMINOPHEN 5-325 MG PO TABS: 1.0000 | ORAL_TABLET | ORAL | Status: DC | PRN

## 2017-01-21 MED ORDER — MISOPROSTOL 25 MCG QUARTER TABLET: 25.0000 ug | ORAL_TABLET | ORAL | Status: DC | PRN

## 2017-01-21 MED ORDER — TERBUTALINE SULFATE 1 MG/ML IJ SOLN
0.2500 mg | Freq: Once | INTRAMUSCULAR | Status: DC | PRN
  Filled 2017-01-21: qty 1

## 2017-01-21 MED ORDER — MISOPROSTOL 50MCG HALF TABLET: 50.0000 ug | ORAL_TABLET | ORAL | Status: DC | PRN

## 2017-01-21 MED ORDER — OXYCODONE-ACETAMINOPHEN 5-325 MG PO TABS: 2.0000 | ORAL_TABLET | ORAL | Status: DC | PRN

## 2017-01-21 MED ORDER — OXYTOCIN BOLUS FROM INFUSION
500.0000 mL | Freq: Once | INTRAVENOUS | Status: AC
  Administered 2017-01-22: 500 mL via INTRAVENOUS

## 2017-01-21 MED ORDER — ACETAMINOPHEN 325 MG PO TABS
650.0000 mg | ORAL_TABLET | ORAL | Status: DC | PRN
  Administered 2017-01-21: 650 mg via ORAL
  Filled 2017-01-21: qty 2

## 2017-01-21 MED ORDER — SOD CITRATE-CITRIC ACID 500-334 MG/5ML PO SOLN: 30.0000 mL | ORAL | Status: DC | PRN

## 2017-01-21 MED ORDER — LIDOCAINE HCL (PF) 1 % IJ SOLN
30.0000 mL | INTRAMUSCULAR | Status: DC | PRN
  Filled 2017-01-21: qty 30

## 2017-01-21 MED ORDER — ONDANSETRON HCL 4 MG/2ML IJ SOLN: 4.0000 mg | Freq: Four times a day (QID) | INTRAMUSCULAR | Status: DC | PRN

## 2017-01-21 MED ORDER — OXYTOCIN 40 UNITS IN LACTATED RINGERS INFUSION - SIMPLE MED: 2.5000 [IU]/h | INTRAVENOUS | Status: DC

## 2017-01-21 MED ORDER — FENTANYL CITRATE (PF) 100 MCG/2ML IJ SOLN
100.0000 ug | INTRAMUSCULAR | Status: DC | PRN
  Administered 2017-01-21 – 2017-01-22 (×3): 100 ug via INTRAVENOUS
  Filled 2017-01-21 (×3): qty 2

## 2017-01-21 MED ORDER — LACTATED RINGERS IV SOLN: 500.0000 mL | INTRAVENOUS | Status: DC | PRN

## 2017-01-21 MED ORDER — LACTATED RINGERS IV SOLN
INTRAVENOUS | Status: DC
  Administered 2017-01-21: 23:00:00 via INTRAVENOUS

## 2017-01-21 MED ORDER — OXYTOCIN 40 UNITS IN LACTATED RINGERS INFUSION - SIMPLE MED
1.0000 m[IU]/min | INTRAVENOUS | Status: DC
  Administered 2017-01-21: 2 m[IU]/min via INTRAVENOUS
  Filled 2017-01-21: qty 1000

## 2017-01-21 NOTE — Anesthesia Pain Management Evaluation Note (Signed)
  CRNA Pain Management Visit Note  Patient: Alison Stone, 31 y.o., female  "Hello I am a member of the anesthesia team at [facility]. We have an anesthesia team available at all times to provide care throughout the hospital, including epidural management and anesthesia for C-section. I don't know your plan for the delivery whether it a natural birth, water birth, IV sedation, nitrous supplementation, doula or epidural, but we want to meet your pain goals."   1.Was your pain managed to your expectations on prior hospitalizations?   No prior hospitalizations  2.What is your expectation for pain management during this hospitalization?     IV pain meds  3.How can we help you reach that goal?   Record the patient's initial score and the patient's pain goal.   Pain: 0  Pain Goal: 4 [facility] wants you to be able to say your pain was always managed very well.  Laban EmperorMalinova,Desira Alessandrini Hristova 01/21/2017

## 2017-01-21 NOTE — Progress Notes (Signed)
Patient ID: Virgel Gessameisha C G[place]den, female   DOB: 05/18/1986, 31 y.o.   MRN: [id number] Labor Progress Note  S: Patient seen & examined for progress of labor. Patient comfortable.   O: BP 118/86   Pulse (!) 114   Temp 98.5 F (36.9 C) (Oral)   Resp 18   Ht 5' (1.524 m)   Wt 72.2 kg (159 lb 3.2 oz)   LMP 04/28/2016 (Approximate)   BMI 31.09 kg/m   FHT: 150bpm, mod var, +accels, no decels TOCO: q2-464min, patient looks comfortable during contractions  CVE: Dilation: 5 Effacement (%): 70 Cervical Position: Middle Station: Ballotable Presentation: Vertex Exam by:: Dr. Talbert ForestShirley  A&P: 31 y.o. [id number] 3659w0d for IOL for A1GDM  Currently on 6 milli-units of pitocin, s/p foley bulb Continue current management Anticipate SVD  [place]il[place]Jordan Demontray Franta, DO FM Resident PGY-1 01/21/2017 5:04 PM

## 2017-01-21 NOTE — Progress Notes (Signed)
Patient ID: Alison Stone, female   DOB: 12/13/1985, 31 y.o.   MRN: [id number] Labor Progress Note  S: Patient seen & examined for progress of labor. Patient comfortable.   O: BP 133/77   Pulse 94   Temp 98.5 F (36.9 C) (Oral)   Resp 18   Ht 5' (1.524 m)   Wt 72.2 kg (159 lb 3.2 oz)   LMP 04/28/2016 (Approximate)   BMI 31.09 kg/m   FHT: 135bpm, mod var, +accels, no decels TOCO: irregula, patient looks comfortable during contractions  CVE: Dilation: 1 Effacement (%): Thick Station: Ballotable Exam by:: Dr. Talbert ForestShirley  A&P: 31 y.o. [id number] 3713w0d IOL for A1GDM  Currently foley bulb in place Continue induction protocol Anticipate SVD  [place]Jordan Treyvion Durkee, DO FM Resident PGY-1 01/21/2017 1:40 PM

## 2017-01-21 NOTE — Progress Notes (Signed)
LABOR PROGRESS NOTE  Alison Stone is a 31 y.o. [phone] at 843w0d  admitted for IOL 2/2 A1GDM  Subjective: Pt is resting on right side.  Objective: BP 117/77   Pulse (!) 124   Temp 99.3 F (37.4 C) (Oral)   Resp 19   Ht 5' (1.524 m)   Wt 72.2 kg (159 lb 3.2 oz)   LMP 04/28/2016 (Approximate)   BMI 31.09 kg/m  or  Vitals:   01/21/17 2000 01/21/17 2018 01/21/17 2032 01/21/17 2100  BP: 107/65 107/62 (!) 102/55 117/77  Pulse: 99 (!) 102 (!) 110 (!) 124  Resp:      Temp:      TempSrc:      Weight:      Height:        FHR: 140 with moderate variability accelerations  W/o decelerations  Toco: regular contractions lasting 1-1.5 minutes q3-4 minutes  Dilation: 5 Effacement (%): 70 Cervical Position: Middle, cervix is anterior and floppy, and not quite entering the pelvis Station: -3 Presentation: Vertex Exam by:: Dr. Emelda FearFerguson  Labs: Lab Results  Component Value Date   WBC 10.5 01/21/2017   HGB 8.6 (L) 01/21/2017   HCT 29.8 (L) 01/21/2017   MCV 69.5 (L) 01/21/2017   PLT 276 01/21/2017    Patient Active Problem List   Diagnosis Date Noted  . Gestational diabetes 12/06/2016  . Iron deficiency anemia during pregnancy 10/13/2016  . Supervision of high risk pregnancy, antepartum, third trimester 10/11/2016    Assessment / Plan: 31 y.o. [id number] at 3043w0d here for IOL 2/2 A1GDM  Labor: AROM @ 2110 Fetal Wellbeing:  Cat 1 Pain Control:  IV PRN Anticipated MOD:  SVD  Alison Stone,  01/21/2017, 9:19 PM

## 2017-01-21 NOTE — H&P (Signed)
LABOR ADMISSION HISTORY AND PHYSICAL  Alison Stone is a 31 y.o. female [phone] with IUP at 74w0dby 25 wk [place] I[place] for A1GDM. She reports +FMs, No LOF, no VB, no blurry vision, no headaches, no peripheral edema, and no RUQ pain.  She plans on breast and bottle feeding. She is unsure for birth control.  Dating: By 25 wk [place]--->  Estimated Date of Delivery: 01/21/17  Sono:   _0 , CWD, normal anatomy, cephalic presentation, 33838F 78% EFW  [facility]  Past Medical History: Past Medical History:  Diagnosis Date  . 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 Medical history non-contributory     Past Surgical History: Past Surgical History:  Procedure Laterality Date  . THROAT SURGERY     cyst blocking airway    Obstetrical History: OB History    Gravida Para Term Preterm AB Living   _1 0 1 4   SAB TAB Ectopic Multiple Live Births   1 0 0 0 4      Social History: Social History   Social History  . Marital status: Single    Spouse name: N/A  . Number of children: N/A  . Years of education: N/A   Social History Main Topics  . Smoking status: Never Smoker  . Smokeless tobacco: Never Used  . Alcohol use No  . Drug use: No  . Sexual activity: Yes    Birth control/ protection: None   Other Topics Concern  . None   Social History Narrative  . None    Family History: Family History  Problem Relation Age of Onset  . Asthma Maternal Aunt   . Kidney disease Maternal Aunt   . Migraines Maternal Aunt   . Nephrolithiasis Paternal Aunt   . Migraines Paternal Aunt   . Stroke Maternal Grandfather   . Hypertension Maternal Grandfather   . Heart failure Maternal Grandfather     Allergies: No Known Allergies  Prescriptions Prior to Admission  Medication Sig Dispense Refill Last Dose  . ACCU-CHEK FASTCLIX LANCETS MISC 1 each by  Percutaneous route 4 (four) times daily. 100 each 12 Taking  . acetaminophen (TYLENOL) 500 MG tablet Take 1,000 mg by mouth every 6 (six) hours as needed for headache.   Taking  . Blood Glucose Monitoring Suppl (ACCU-CHEK AVIVA PLUS) w/Device KIT 1 Device by Does not apply route 4 (four) times daily. 1 kit 0 Taking  . glucose blood test strip Use to check blood sugars four times as instructed 100 each 12 Taking  . iron polysaccharides (NIFEREX) 150 MG capsule Take 1 capsule (150 mg total) by mouth 2 (two) times daily. Take 1 tab daily for first week then take 1 tab BID (Patient not taking: Reported on 01/20/2017) 30 capsule 6 Not Taking  . Prenatal MV & Min w/FA-DHA (PRENATAL ADULT GUMMY/DHA/FA PO) Take 2 tablets by mouth daily.   Taking     Review of Systems   All systems reviewed and negative except as stated in HPI  Blood pressure 118/66, pulse 94, temperature 99.1 F (37.3 C), temperature source Oral, resp. rate 16, height 5' (1.524 m), weight 72.2 kg (159 lb 3.2 oz), last menstrual period 04/28/2016. General appearance: alert and cooperative  CV: regular rate Lungs: normal WOB Extremities: Homans sign is negative, no sign of DVT Presentation: cephalic Fetal monitoringBaseline: 150 bpm,  Variability: Good {> 6 bpm), Accelerations: Reactive and Decelerations: Absent Uterine activityNone Dilation: 1 Effacement (%): Thick Station: Ballotable Exam by:: Dr. Enid Derry   Prenatal labs: ABO, Rh: AB/Positive/-- (04/16 1133) Antibody: Negative (04/16 1133) Rubella: Immune RPR: Non Reactive (05/22 0949)  HBsAg: Negative (04/16 1133)  HIV:   Non-reactive GBS: Negative (06/29 0913)  GTT: Fasting- 94, 1 hr- 116, 2 hr- 103  Prenatal Transfer Tool  Maternal Diabetes: Yes:  Diabetes Type:  Diet controlled Genetic Screening: Too Late Maternal Ultrasounds/Referrals: Normal Fetal Ultrasounds or other Referrals:  None Maternal Substance Abuse:  No Significant Maternal Medications:   None Significant Maternal Lab Results: Lab values include: Group B Strep negative  Results for orders placed or performed during the hospital encounter of 01/21/17 (from the past 24 hour(s))  CBC   Collection Time: 01/21/17  8:01 AM  Result Value Ref Range   WBC 10.5 4.0 - 10.5 K/uL   RBC 4.29 3.87 - 5.11 MIL/uL   Hemoglobin 8.6 (L) 12.0 - 15.0 g/dL   HCT 29.8 (L) 36.0 - 46.0 %   MCV 69.5 (L) 78.0 - 100.0 fL   MCH 20.0 (L) 26.0 - 34.0 pg   MCHC 28.9 (L) 30.0 - 36.0 g/dL   RDW 19.6 (H) 11.5 - 15.5 %   Platelets 276 150 - 400 K/uL  Glucose, capillary   Collection Time: 01/21/17  8:15 AM  Result Value Ref Range   Glucose-Capillary 81 65 - 99 mg/dL    Patient Active Problem List   Diagnosis Date Noted  . Gestational diabetes 12/06/2016  . Iron deficiency anemia during pregnancy 10/13/2016  . Supervision of high risk pregnancy, antepartum, third trimester 10/11/2016    Assessment: Alison Stone is a 31 y.o. [id number] at 62w0dhere for IOL for A1GDM  #Labor: Induction protocol- placed foley bulb.  #Pain: Epidural upon request  #FWB: Cat 1 #ID: GBS negative #MOF: Breast and bottle #MOC: unsure #Circ: outpatient  JMartiniqueShirley, DO Family Medicine Resident PGY-1  01/21/2017, 9:33 AM  OB FELLOW HISTORY AND PHYSICAL ATTESTATION  I confirm that I have verified the information documented in the resident's note and that I have also personally reperformed the physical exam and all medical decision making activities. I agree with above documentation and have made edits as needed.   JLuiz BlareOB Fellow 01/21/2017, 10:01 AM

## 2017-01-22 ENCOUNTER — Encounter (HOSPITAL_COMMUNITY): Payer: Self-pay

## 2017-01-22 ENCOUNTER — Inpatient Hospital Stay (HOSPITAL_COMMUNITY): Payer: Medicaid Other | Admitting: Anesthesiology

## 2017-01-22 DIAGNOSIS — Z3A40 40 weeks gestation of pregnancy: Secondary | ICD-10-CM

## 2017-01-22 DIAGNOSIS — O24429 Gestational diabetes mellitus in childbirth, unspecified control: Secondary | ICD-10-CM

## 2017-01-22 MED ORDER — IBUPROFEN 600 MG PO TABS
600.0000 mg | ORAL_TABLET | Freq: Four times a day (QID) | ORAL | Status: DC
  Administered 2017-01-22: 600 mg via ORAL
  Filled 2017-01-22: qty 1

## 2017-01-22 MED ORDER — DIPHENHYDRAMINE HCL 50 MG/ML IJ SOLN: 12.5000 mg | INTRAMUSCULAR | Status: DC | PRN

## 2017-01-22 MED ORDER — COMPLETENATE 29-1 MG PO CHEW
1.0000 | CHEWABLE_TABLET | Freq: Every day | ORAL | Status: DC
  Administered 2017-01-22 – 2017-01-23 (×2): 1 via ORAL
  Filled 2017-01-22 (×3): qty 1

## 2017-01-22 MED ORDER — PHENYLEPHRINE 40 MCG/ML (10ML) SYRINGE FOR IV PUSH (FOR BLOOD PRESSURE SUPPORT)
80.0000 ug | PREFILLED_SYRINGE | INTRAVENOUS | Status: DC | PRN
  Filled 2017-01-22: qty 5

## 2017-01-22 MED ORDER — LIDOCAINE HCL (PF) 1 % IJ SOLN
INTRAMUSCULAR | Status: DC | PRN
  Administered 2017-01-22: 2 mL via EPIDURAL
  Administered 2017-01-22: 3 mL via EPIDURAL
  Administered 2017-01-22: 5 mL via EPIDURAL

## 2017-01-22 MED ORDER — BUTORPHANOL TARTRATE 2 MG/ML IJ SOLN: 2.0000 mg | INTRAMUSCULAR | Status: DC | PRN

## 2017-01-22 MED ORDER — FENTANYL 2.5 MCG/ML BUPIVACAINE 1/10 % EPIDURAL INFUSION (WH - ANES)
INTRAMUSCULAR | Status: AC
  Filled 2017-01-22: qty 100

## 2017-01-22 MED ORDER — LACTATED RINGERS IV SOLN
INTRAVENOUS | Status: DC
  Administered 2017-01-22: 02:00:00 via INTRAUTERINE

## 2017-01-22 MED ORDER — PRENATAL MULTIVITAMIN CH: 1.0000 | ORAL_TABLET | Freq: Every day | ORAL | Status: DC

## 2017-01-22 MED ORDER — LACTATED RINGERS IV SOLN: 500.0000 mL | Freq: Once | INTRAVENOUS | Status: DC

## 2017-01-22 MED ORDER — COCONUT OIL OIL: 1.0000 "application " | TOPICAL_OIL | Status: DC | PRN

## 2017-01-22 MED ORDER — ZOLPIDEM TARTRATE 5 MG PO TABS: 5.0000 mg | ORAL_TABLET | Freq: Every evening | ORAL | Status: DC | PRN

## 2017-01-22 MED ORDER — DIPHENHYDRAMINE HCL 25 MG PO CAPS: 25.0000 mg | ORAL_CAPSULE | Freq: Four times a day (QID) | ORAL | Status: DC | PRN

## 2017-01-22 MED ORDER — BENZOCAINE-MENTHOL 20-0.5 % EX AERO
1.0000 "application " | INHALATION_SPRAY | CUTANEOUS | Status: DC | PRN
  Administered 2017-01-22: 1 via TOPICAL
  Filled 2017-01-22: qty 56

## 2017-01-22 MED ORDER — SENNOSIDES-DOCUSATE SODIUM 8.6-50 MG PO TABS
2.0000 | ORAL_TABLET | ORAL | Status: DC
  Administered 2017-01-23: 2 via ORAL
  Filled 2017-01-22: qty 2

## 2017-01-22 MED ORDER — ONDANSETRON HCL 4 MG PO TABS: 4.0000 mg | ORAL_TABLET | ORAL | Status: DC | PRN

## 2017-01-22 MED ORDER — SIMETHICONE 80 MG PO CHEW: 80.0000 mg | CHEWABLE_TABLET | ORAL | Status: DC | PRN

## 2017-01-22 MED ORDER — PHENYLEPHRINE 40 MCG/ML (10ML) SYRINGE FOR IV PUSH (FOR BLOOD PRESSURE SUPPORT)
PREFILLED_SYRINGE | INTRAVENOUS | Status: AC
  Filled 2017-01-22: qty 20

## 2017-01-22 MED ORDER — ONDANSETRON HCL 4 MG/2ML IJ SOLN: 4.0000 mg | INTRAMUSCULAR | Status: DC | PRN

## 2017-01-22 MED ORDER — PROMETHAZINE HCL 25 MG/ML IJ SOLN: 12.5000 mg | INTRAMUSCULAR | Status: DC | PRN

## 2017-01-22 MED ORDER — TETANUS-DIPHTH-ACELL PERTUSSIS 5-2.5-18.5 LF-MCG/0.5 IM SUSP: 0.5000 mL | Freq: Once | INTRAMUSCULAR | Status: DC

## 2017-01-22 MED ORDER — EPHEDRINE 5 MG/ML INJ
10.0000 mg | INTRAVENOUS | Status: DC | PRN
  Filled 2017-01-22: qty 2

## 2017-01-22 MED ORDER — DIBUCAINE 1 % RE OINT: 1.0000 "application " | TOPICAL_OINTMENT | RECTAL | Status: DC | PRN

## 2017-01-22 MED ORDER — OXYCODONE-ACETAMINOPHEN 5-325 MG PO TABS
2.0000 | ORAL_TABLET | Freq: Once | ORAL | Status: AC
  Administered 2017-01-22: 2 via ORAL
  Filled 2017-01-22: qty 2

## 2017-01-22 MED ORDER — ACETAMINOPHEN 325 MG PO TABS: 650.0000 mg | ORAL_TABLET | ORAL | Status: DC | PRN

## 2017-01-22 MED ORDER — IBUPROFEN 100 MG/5ML PO SUSP
600.0000 mg | Freq: Four times a day (QID) | ORAL | Status: DC
  Administered 2017-01-22 – 2017-01-23 (×5): 600 mg via ORAL
  Filled 2017-01-22 (×9): qty 30

## 2017-01-22 MED ORDER — FENTANYL 2.5 MCG/ML BUPIVACAINE 1/10 % EPIDURAL INFUSION (WH - ANES)
14.0000 mL/h | INTRAMUSCULAR | Status: DC | PRN
  Administered 2017-01-22: 14 mL/h via EPIDURAL

## 2017-01-22 MED ORDER — WITCH HAZEL-GLYCERIN EX PADS: 1.0000 "application " | MEDICATED_PAD | CUTANEOUS | Status: DC | PRN

## 2017-01-22 NOTE — Anesthesia Preprocedure Evaluation (Signed)
Anesthesia Evaluation  Patient identified by MRN, date of birth, ID band Patient awake    Reviewed: Allergy & Precautions, NPO status , Patient's Chart, lab work & pertinent test results  Airway Mallampati: II  TM Distance: >3 FB Neck ROM: Full    Dental  (+) Teeth Intact, Dental Advisory Given   Pulmonary neg pulmonary ROS,    Pulmonary exam normal breath sounds clear to auscultation       Cardiovascular negative cardio ROS Normal cardiovascular exam Rhythm:Regular Rate:Normal     Neuro/Psych negative neurological ROS     GI/Hepatic negative GI ROS, Neg liver ROS,   Endo/Other  diabetes, Well Controlled, GestationalObesity   Renal/GU negative Renal ROS     Musculoskeletal negative musculoskeletal ROS (+)   Abdominal   Peds  Hematology  (+) Blood dyscrasia, anemia , Plt 276k    Anesthesia Other Findings Day of surgery medications reviewed with the patient.  Reproductive/Obstetrics (+) Pregnancy                             Anesthesia Physical Anesthesia Plan  ASA: II  Anesthesia Plan: Epidural   Post-op Pain Management:    Induction:   PONV Risk Score and Plan: Treatment may vary due to age or medical condition  Airway Management Planned:   Additional Equipment:   Intra-op Plan:   Post-operative Plan:   Informed Consent: I have reviewed the patients History and Physical, chart, labs and discussed the procedure including the risks, benefits and alternatives for the proposed anesthesia with the patient or authorized representative who has indicated his/her understanding and acceptance.   Dental advisory given  Plan Discussed with:   Anesthesia Plan Comments: (Patient identified. Risks/Benefits/Options discussed with patient including but not limited to bleeding, infection, nerve damage, paralysis, failed block, incomplete pain control, headache, blood pressure changes,  nausea, vomiting, reactions to medication both or allergic, itching and postpartum back pain. Confirmed with bedside nurse the patient's most recent platelet count. Confirmed with patient that they are not currently taking any anticoagulation, have any bleeding history or any family history of bleeding disorders. Patient expressed understanding and wished to proceed. All questions were answered. )        Anesthesia Quick Evaluation

## 2017-01-22 NOTE — Anesthesia Postprocedure Evaluation (Signed)
Anesthesia Post Note  Patient: Alison Stone  Procedure(s) Performed: * No procedures listed *     Patient location during evaluation: Mother Baby Anesthesia Type: Epidural Level of consciousness: awake and alert and oriented Pain management: pain level controlled Vital Signs Assessment: post-procedure vital signs reviewed and stable Respiratory status: spontaneous breathing and nonlabored ventilation Cardiovascular status: stable Postop Assessment: no headache, patient able to bend at knees, no backache, no signs of nausea or vomiting, epidural receding and adequate PO intake Anesthetic complications: no    Last Vitals:  Vitals:   01/22/17 0520 01/22/17 0612  BP: 125/74 131/84  Pulse: 89 83  Resp:  18  Temp: 37.3 C 36.7 C    Last Pain:  Vitals:   01/22/17 0612  TempSrc: Oral  PainSc: 6    Pain Goal: Patients Stated Pain Goal: 7 (01/21/17 1806)               Laban EmperorMalinova,Machele Deihl Hristova

## 2017-01-22 NOTE — Progress Notes (Signed)
LABOR PROGRESS NOTE  Virgel Gessameisha C Hassan is a 31 y.o. [id number] at 6747w1d  admitted for IOL 2/2 gDMA1  Subjective: Patient resting comfortably in bed with epidural in place. Patient denies headache, vision changes, chest pain, shortness of breath, or RUQ pain.   Objective: BP 114/70   Pulse (!) 102   Temp 99.2 F (37.3 C) (Oral)   Resp 19   Ht 5' (1.524 m)   Wt 72.2 kg (159 lb 3.2 oz)   LMP 04/28/2016 (Approximate)   SpO2 100%   BMI 31.09 kg/m  or  Vitals:   01/22/17 0152 01/22/17 0155 01/22/17 0156 01/22/17 0201  BP: 111/86  124/63 114/70  Pulse: (!) 102  (!) 101 (!) 102  Resp:      Temp:      TempSrc:      SpO2: 99% 99%  100%  Weight:      Height:      FHT: 140bpm/mod/+ac/+dc, variables with contraction, short duration, immediate recovery Toco: 125mvu, every 1-4 minutes Dilation: 6 Effacement (%): 70 Cervical Position: Middle Station: -3 Presentation: Vertex Exam by:: Philipp DeputyKim Shaw CNM  Labs: Lab Results  Component Value Date   WBC 10.5 01/21/2017   HGB 8.6 (L) 01/21/2017   HCT 29.8 (L) 01/21/2017   MCV 69.5 (L) 01/21/2017   PLT 276 01/21/2017   CBG (last 3)  Recent Labs  01/21/17 0815 01/21/17 1221  GLUCAP 81 74   Patient Active Problem List   Diagnosis Date Noted  . Gestational diabetes 12/06/2016  . Iron deficiency anemia during pregnancy 10/13/2016  . Supervision of high risk pregnancy, antepartum, third trimester 10/11/2016    Assessment / Plan: 31 y.o. [id number] at 3347w1d here for IOL 2/2 gDMA1  Labor: Pitocin running at 4422mU/min, binder and IUPC in place GDM-A1: Stable, not requiring glucostabilizer Fetal Wellbeing:  Cat 2, will give bolus and reposition to alleviate cord compression, intervene as necessary Pain Control:  Epidural in place Anticipated MOD:  Vaginal  Conard NovakJoshua A Ghalia Reicks, MD 01/22/2017, 2:02 AM

## 2017-01-22 NOTE — Anesthesia Procedure Notes (Signed)
Epidural Patient location during procedure: OB Start time: 01/22/2017 1:42 AM End time: 01/22/2017 1:47 AM  Staffing Anesthesiologist: Cecile HearingURK, Alison Stone Performed: anesthesiologist   Preanesthetic Checklist Completed: patient identified, pre-op evaluation, timeout performed, IV checked, risks and benefits discussed and monitors and equipment checked  Epidural Patient position: sitting Prep: DuraPrep Patient monitoring: blood pressure and continuous pulse ox Approach: midline Location: L3-L4 Injection technique: LOR air  Needle:  Needle type: Tuohy  Needle gauge: 17 G Needle length: 9 cm Needle insertion depth: 5 cm Catheter size: 19 Gauge Catheter at skin depth: 10 cm Test dose: negative and Other (1% Lidocaine)  Additional Notes Patient identified.  Risk benefits discussed including failed block, incomplete pain control, headache, nerve damage, paralysis, blood pressure changes, nausea, vomiting, reactions to medication both toxic or allergic, and postpartum back pain.  Patient expressed understanding and wished to proceed.  All questions were answered.  Sterile technique used throughout procedure and epidural site dressed with sterile barrier dressing. No paresthesia or other complications noted. The patient did not experience any signs of intravascular injection such as tinnitus or metallic taste in mouth nor signs of intrathecal spread such as rapid motor block. Please see nursing notes for vital signs. Reason for block:procedure for pain

## 2017-01-22 NOTE — Progress Notes (Signed)
Patient ID: Virgel Gessameisha C Goggins, female   DOB: 06/03/1986, 31 y.o.   MRN: [id number]  Feeling ctx as stronger; has been getting Fentanyl  VSS, afeb FHR 135-140, +accels, no decels Ctx q 1-2 mins w/ Pit @ 8718mu/min Cx unchanged (6/70/-3); baby all on pt's right side  IUP@term  GDMA1 Latent labor  IUPC inserted without difficulty to evaluate ctx strength Will order abd binder to help line baby up  Cam HaiSHAW, Arjay Jaskiewicz CNM 01/22/2017 12:21 AM

## 2017-01-23 MED ORDER — IBUPROFEN 800 MG PO TABS: 800.0000 mg | ORAL_TABLET | Freq: Three times a day (TID) | ORAL | 0 refills | Status: AC | PRN

## 2017-01-23 MED ORDER — ACETAMINOPHEN 160 MG/5ML PO SOLN
650.0000 mg | Freq: Four times a day (QID) | ORAL | Status: DC | PRN
  Administered 2017-01-23: 650 mg via ORAL
  Filled 2017-01-23: qty 20.3

## 2017-01-23 NOTE — Discharge Summary (Signed)
  OB Discharge Summary     Patient Name: Alison Stone DOB: 12/05/1985 MRN: [id number]  Date of admission: 01/21/2017 Delivering MD: CHRISTIAN, JOSHUA A   Date of discharge: 01/23/2017  Admitting diagnosis: INDUCTION Intrauterine pregnancy: [redacted]w[redacted]d     Secondary diagnosis:  Principal Problem:   NSVD (normal spontaneous vaginal delivery)  Additional problems: A1gDM     Discharge diagnosis: Term Pregnancy Delivered and GDM A1                                                                                                Post partum procedures:None  Augmentation: AROM, Pitocin and Foley Balloon  Complications: None  Hospital course:  Induction of Labor With Vaginal Delivery   31 y.o. yo G6P5015 at [redacted]w[redacted]d was admitted to the hospital 01/21/2017 for induction of labor.  Indication for induction: A1 DM.  Admit hx: Alison Stone is a 31 y.o. female G6P4014 with IUP at [redacted]w[redacted]d by 25 wk US presenting for IOL for A1GDM. She reports +FMs, No LOF, no VB, no blurry vision, no headaches, no peripheral edema, and no RUQ pain.  She plans on breast and bottle feeding. She is unsure for birth control.  Dating: By 25 wk US --->  Estimated Date of Delivery: 01/21/17 Sono:  @[redacted]w[redacted]d, CWD, normal anatomy, cephalic presentation, 3383g, 78% EFW   Patient had an uncomplicated labor course as follows: Membrane Rupture Time/Date: 8:55 PM ,01/21/2017   Intrapartum Procedures: Episiotomy: None [1]                                         Lacerations:  None [1]  Patient had delivery of a Viable infant.  Information for the patient's newborn:  Alison Stone, Alison Stone [[id number]]  Delivery Method: Vaginal, Spontaneous Delivery (Filed from Delivery Summary)   01/22/2017  Details of delivery can be found in separate delivery note.  Patient denied headache, vision changes, chest pain, shortness of breath, or RUQ pain the morning of discharge. Blood glucose was well in the normal range after delivery. Patient had a  routine postpartum course. Patient is discharged home 01/23/17.  Physical exam  Vitals:   01/22/17 0612 01/22/17 1829 01/22/17 1950 01/23/17 0600  BP: 131/84 116/75 109/77 127/85  Pulse: 83 (!) 101 98 (!) 105  Resp: 18 16 18 20  Temp: 98 F (36.7 C) 98.4 F (36.9 C) 98 F (36.7 C) 97.9 F (36.6 C)  TempSrc: Oral Oral Oral Oral  SpO2:  100%    Weight:      Height:       General: alert, cooperative and no distress Lochia: appropriate Uterine Fundus: firm Incision: N/A DVT Evaluation: No evidence of DVT seen on physical exam. Labs: Lab Results  Component Value Date   WBC 10.5 01/21/2017   HGB 8.6 (L) 01/21/2017   HCT 29.8 (L) 01/21/2017   MCV 69.5 (L) 01/21/2017   PLT 276 01/21/2017   CMP Latest Ref Rng & Units 06/14/2016  Glucose 65 - 99 mg/dL 87    BUN 6 - 20 mg/dL 8  Creatinine 0.44 - 1.00 mg/dL 0.65  Sodium 135 - 145 mmol/L 134(L)  Potassium 3.5 - 5.1 mmol/L 3.7  Chloride 101 - 111 mmol/L 99(L)  CO2 22 - 32 mmol/L 24  Calcium 8.9 - 10.3 mg/dL 8.9  Total Protein 6.5 - 8.1 g/dL 7.3  Total Bilirubin 0.3 - 1.2 mg/dL 0.8  Alkaline Phos 38 - 126 U/L 88  AST 15 - 41 U/L 23  ALT 14 - 54 U/L 24   CBG (last 3)   Recent Labs  01/21/17 0815 01/21/17 1221  GLUCAP 81 74    Discharge instruction: per After Visit Summary and "Baby and Me Booklet".  After visit meds:  Allergies as of 01/23/2017   No Known Allergies     Medication List    STOP taking these medications   iron polysaccharides 150 MG capsule Commonly known as:  NIFEREX     TAKE these medications   ACCU-CHEK AVIVA PLUS w/Device Kit 1 Device by Does not apply route 4 (four) times daily.   ACCU-CHEK FASTCLIX LANCETS Misc 1 each by Percutaneous route 4 (four) times daily.   acetaminophen 500 MG tablet Commonly known as:  TYLENOL Take 1,000 mg by mouth every 6 (six) hours as needed for headache.   glucose blood test strip Use to check blood sugars four times as instructed   ibuprofen 800 MG  tablet Commonly known as:  ADVIL,MOTRIN Take 1 tablet (800 mg total) by mouth every 8 (eight) hours as needed.   PRENATAL ADULT GUMMY/DHA/FA PO Take 2 tablets by mouth daily.       Diet: routine diet  Activity: Advance as tolerated. Pelvic rest for 6 weeks.   Outpatient follow up:6 weeks Follow up Appt:Future Appointments Date Time Provider [place]  02/21/2017 2:40 PM Luvenia Redden, PA-C [facility]   Postpartum contraception: Progesterone only pills  Newborn Data: Live born female  Birth Weight: 8 lb 2.7 oz (3705 g) APGAR: 9, 9  Baby Feeding: Bottle and Breast Disposition:home with mother   01/23/2017 Aneta Mins, MD  OB FELLOW DISCHARGE ATTESTATION  I have seen and examined this patient and agree with above documentation in the resident's note.   Gailen Shelter, MD OB Fellow 9:09 AM

## 2017-01-24 LAB — GLUCOSE, CAPILLARY
GLUCOSE-CAPILLARY: 101 mg/dL — AB (ref 65–99)
GLUCOSE-CAPILLARY: 111 mg/dL — AB (ref 65–99)
Glucose-Capillary: 103 mg/dL — ABNORMAL HIGH (ref 65–99)

## 2017-02-21 ENCOUNTER — Ambulatory Visit: Payer: Medicaid Other | Admitting: Medical

## 2017-02-22 ENCOUNTER — Encounter: Payer: Self-pay | Admitting: Medical

## 2017-02-22 ENCOUNTER — Telehealth: Payer: Self-pay | Admitting: Medical

## 2017-02-22 NOTE — Telephone Encounter (Signed)
Called patient about her missed appointment. She has been rescheduled.

## 2017-03-08 ENCOUNTER — Encounter: Payer: Self-pay | Admitting: General Practice

## 2017-03-08 ENCOUNTER — Ambulatory Visit: Payer: Medicaid Other | Admitting: Obstetrics and Gynecology

## 2017-07-27 ENCOUNTER — Telehealth: Payer: Self-pay | Admitting: Obstetrics

## 2017-11-28 DIAGNOSIS — N760 Acute vaginitis: Secondary | ICD-10-CM | POA: Diagnosis not present

## 2018-06-02 ENCOUNTER — Encounter: Payer: Self-pay | Admitting: Student

## 2018-06-02 ENCOUNTER — Other Ambulatory Visit (HOSPITAL_COMMUNITY)
Admission: RE | Admit: 2018-06-02 | Discharge: 2018-06-02 | Disposition: A | Discharge status: Home / Self Care | Payer: Medicaid Other | Source: Ambulatory Visit | Attending: Student | Admitting: Student

## 2018-06-02 ENCOUNTER — Ambulatory Visit (INDEPENDENT_AMBULATORY_CARE_PROVIDER_SITE_OTHER): Payer: Medicaid Other | Admitting: Student

## 2018-06-02 VITALS — BP 122/80 | HR 104 | Ht 60.0 in | Wt 145.8 lb

## 2018-06-02 DIAGNOSIS — N898 Other specified noninflammatory disorders of vagina: Secondary | ICD-10-CM | POA: Insufficient documentation

## 2018-06-02 DIAGNOSIS — Z0000 Encounter for general adult medical examination without abnormal findings: Secondary | ICD-10-CM

## 2018-06-02 DIAGNOSIS — Z8632 Personal history of gestational diabetes: Secondary | ICD-10-CM

## 2018-06-02 DIAGNOSIS — Z01419 Encounter for gynecological examination (general) (routine) without abnormal findings: Secondary | ICD-10-CM

## 2018-06-02 DIAGNOSIS — Z30016 Encounter for initial prescription of transdermal patch hormonal contraceptive device: Secondary | ICD-10-CM

## 2018-06-02 LAB — GLUCOSE, CAPILLARY: GLUCOSE-CAPILLARY: 84 mg/dL (ref 70–99)

## 2018-06-02 LAB — POCT PREGNANCY, URINE: PREG TEST UR: NEGATIVE

## 2018-06-02 MED ORDER — NORELGESTROMIN-ETH ESTRADIOL 150-35 MCG/24HR TD PTWK: 1.0000 | MEDICATED_PATCH | TRANSDERMAL | 12 refills | Status: AC

## 2018-06-02 NOTE — Progress Notes (Signed)
GYNECOLOGY CLINIC ANNUAL PREVENTATIVE CARE ENCOUNTER NOTE  Subjective:   Alison Stone is a 32 y.o. [phone] female here for a routine annual gynecologic exam.  Current complaints: foul smelling vaginal discharge & irritation.   Denies abnormal vaginal bleeding, pelvic pain, problems with intercourse or other gynecologic concerns.  Is also interested in contraception. Has used OCPs in the past but would like to use the patch.  Is in monogamous relationship with 1 partner for the last 2 years and does not use condoms.  She does not have a PCP   Gynecologic History Patient's last menstrual period was 05/13/2018 (approximate). Contraception: none Last Pap: 04/2016. Results were: normal Last mammogram: n/a.  Obstetric History OB History  Gravida Para Term Preterm AB Living  6 5 5  0 1 5  SAB TAB Ectopic Multiple Live Births  1 0 0 0 5    # Outcome Date GA Lbr Len/2nd Weight Sex Delivery Anes PTL Lv  6 Term 01/22/17 [redacted]w[redacted]d / 00:19 8 lb 2.7 oz (3.705 kg) M Vag-Spont EPI  LIV  5 Term 10/24/13 [redacted]w[redacted]d 04:24 / 00:20 8 lb 2.9 oz (3.71 kg) M Vag-Spont None  LIV  4 Term 08/14/11 [redacted]w[redacted]d 03:55 / 02:00 8 lb 2.5 oz (3.7 kg) F Vag-Spont None  LIV     Birth Comments: none  3 Term 2009 [redacted]w[redacted]d 24:00 7 lb (3.175 kg) F Vag-Spont None  LIV  2 SAB 2006          1 Term 2005 [redacted]w[redacted]d 24:00 8 lb (3.629 kg) F Vag-Spont None  LIV     Birth Comments: System Generated. Please review and update pregnancy details.    Past Medical History:  Diagnosis Date  . Diabetes mellitus without complication (HCC)   . 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 THROAT SURGERY     cyst blocking airway    Current Outpatient Medications on File Prior to Visit  Medication Sig Dispense Refill  . acetaminophen (TYLENOL) 500 MG tablet Take 1,000 mg by mouth every 6 (six) hours as needed for headache.    . ibuprofen (ADVIL,MOTRIN) 800 MG tablet  Take 1 tablet (800 mg total) by mouth every 8 (eight) hours as needed. 30 tablet 0  . Prenatal MV & Min w/FA-DHA (PRENATAL ADULT GUMMY/DHA/FA PO) Take 2 tablets by mouth daily.    . [DISCONTINUED] Norethindrone Acetate-Ethinyl Estrad-FE (LOESTRIN 24 FE) 1-20 MG-MCG(24) tablet Take 1 tablet by mouth daily. (Patient not taking: Reported on 05/14/2015) 1 Package 11   No current facility-administered medications on file prior to visit.     No Known Allergies    Family History  Problem Relation Age of Onset  . Asthma Maternal Aunt   . Kidney disease Maternal Aunt   . Migraines Maternal Aunt   . Nephrolithiasis Paternal Aunt   . Migraines Paternal Aunt   . Stroke Maternal Grandfather   . Hypertension Maternal Grandfather   . Heart failure Maternal Grandfather     The following portions of the patient's history were reviewed and updated as appropriate: allergies, current medications, past family history, past medical history, past social history, past surgical history and problem list.  Review of Systems Pertinent items are noted in HPI.   Objective:  BP 122/80   Pulse (!) 104   Ht 5' (1.524 m)   Wt 145 lb 12.8 oz (66.1 kg)   LMP  05/13/2018 (Approximate)   BMI 28.47 kg/m  CONSTITUTIONAL: Well-developed, well-nourished female in no acute distress.  HENT:  Normocephalic, atraumatic, External right and left ear normal.  EYES: Conjunctivae and EOM are normal. Pupils are equal, round, and reactive to light. No scleral icterus.  SKIN: Skin is warm and dry. No rash noted. Not diaphoretic. No erythema. No pallor. NEUROLGIC: Alert and oriented to person, place, and time. Normal reflexes, muscle tone coordination. No cranial nerve deficit noted. PSYCHIATRIC: Normal mood and affect. Normal behavior. Normal judgment and thought content. CARDIOVASCULAR: Normal heart rate noted, regular rhythm RESPIRATORY: Clear to auscultation bilaterally. Effort and breath sounds normal, no problems with  respiration noted. BREASTS: Symmetric in size. No masses, skin changes, nipple drainage, or lymphadenopathy. ABDOMEN: Soft, normal bowel sounds, no distention noted.  No tenderness, rebound or guarding.  PELVIC: Moderate amount of gray frothy discharge. Friable cervix. Normal appearing external genitalia.  Pap smear not obtained.  Normal uterine size, no other palpable masses, no uterine or adnexal tenderness. No CMT   Assessment:  Annual gynecologic examination without pap smear   Reviewed different forms of birth control. Patient interested in ortho evra patch. Pt does not have any contraindications to this birth control. Reviewed concerning signs & resents to discontinue patch or present to ER.    Plan:  1. Well woman exam with routine gynecological exam -pap smear up to date  2. Encounter for initial prescription of transdermal patch hormonal contraceptive device  - norelgestromin-ethinyl estradiol (ORTHO EVRA) 150-35 MCG/24HR transdermal patch; Place 1 patch onto the skin once a week.  Dispense: 3 patch; Refill: 12  3. Vaginal discharge  - Cervicovaginal ancillary only( Ramirez-Perez)  4. Hx gestational diabetes -Hx of GDM, did not have f/u labs & doesn't have a PCP. Fasting CBG today normal.  - POCT Glucose (CBG)    Judeth HornLawrence, Katrianna Friesenhahn, NP

## 2018-06-02 NOTE — Patient Instructions (Signed)
Contraception Choices Contraception, also called birth control, refers to methods or devices that prevent pregnancy. Hormonal methods Contraceptive implant A contraceptive implant is a thin, plastic tube that contains a hormone. It is inserted into the upper part of the arm. It can remain in place for up to 3 years. Progestin-only injections Progestin-only injections are injections of progestin, a synthetic form of the hormone progesterone. They are given every 3 months by a health care provider. Birth control pills Birth control pills are pills that contain hormones that prevent pregnancy. They must be taken once a day, preferably at the same time each day. Birth control patch The birth control patch contains hormones that prevent pregnancy. It is placed on the skin and must be changed once a week for three weeks and removed on the fourth week. A prescription is needed to use this method of contraception. Vaginal ring A vaginal ring contains hormones that prevent pregnancy. It is placed in the vagina for three weeks and removed on the fourth week. After that, the process is repeated with a new ring. A prescription is needed to use this method of contraception. Emergency contraceptive Emergency contraceptives prevent pregnancy after unprotected sex. They come in pill form and can be taken up to 5 days after sex. They work best the sooner they are taken after having sex. Most emergency contraceptives are available without a prescription. This method should not be used as your only form of birth control. Barrier methods Female condom A female condom is a thin sheath that is worn over the penis during sex. Condoms keep sperm from going inside a woman's body. They can be used with a spermicide to increase their effectiveness. They should be disposed after a single use. Female condom A female condom is a soft, loose-fitting sheath that is put into the vagina before sex. The condom keeps sperm from going  inside a woman's body. They should be disposed after a single use. Diaphragm A diaphragm is a soft, dome-shaped barrier. It is inserted into the vagina before sex, along with a spermicide. The diaphragm blocks sperm from entering the uterus, and the spermicide kills sperm. A diaphragm should be left in the vagina for 6-8 hours after sex and removed within 24 hours. A diaphragm is prescribed and fitted by a health care provider. A diaphragm should be replaced every 1-2 years, after giving birth, after gaining more than 15 lb (6.8 kg), and after pelvic surgery. Cervical cap A cervical cap is a round, soft latex or plastic cup that fits over the cervix. It is inserted into the vagina before sex, along with spermicide. It blocks sperm from entering the uterus. The cap should be left in place for 6-8 hours after sex and removed within 48 hours. A cervical cap must be prescribed and fitted by a health care provider. It should be replaced every 2 years. Sponge A sponge is a soft, circular piece of polyurethane foam with spermicide on it. The sponge helps block sperm from entering the uterus, and the spermicide kills sperm. To use it, you make it wet and then insert it into the vagina. It should be inserted before sex, left in for at least 6 hours after sex, and removed and thrown away within 30 hours. Spermicides Spermicides are chemicals that kill or block sperm from entering the cervix and uterus. They can come as a cream, jelly, suppository, foam, or tablet. A spermicide should be inserted into the vagina with an applicator at least 10-15 minutes before   sex to allow time for it to work. The process must be repeated every time you have sex. Spermicides do not require a prescription. Intrauterine contraception Intrauterine device (IUD) An IUD is a T-shaped device that is put in a woman's uterus. There are two types:  Hormone IUD.This type contains progestin, a synthetic form of the hormone progesterone. This  type can stay in place for 3-5 years.  Copper IUD.This type is wrapped in copper wire. It can stay in place for 10 years.  Permanent methods of contraception Female tubal ligation In this method, a woman's fallopian tubes are sealed, tied, or blocked during surgery to prevent eggs from traveling to the uterus. Hysteroscopic sterilization In this method, a small, flexible insert is placed into each fallopian tube. The inserts cause scar tissue to form in the fallopian tubes and block them, so sperm cannot reach an egg. The procedure takes about 3 months to be effective. Another form of birth control must be used during those 3 months. Female sterilization This is a procedure to tie off the tubes that carry sperm (vasectomy). After the procedure, the man can still ejaculate fluid (semen). Natural planning methods Natural family planning In this method, a couple does not have sex on days when the woman could become pregnant. Calendar method This means keeping track of the length of each menstrual cycle, identifying the days when pregnancy can happen, and not having sex on those days. Ovulation method In this method, a couple avoids sex during ovulation. Symptothermal method This method involves not having sex during ovulation. The woman typically checks for ovulation by watching changes in her temperature and in the consistency of cervical mucus. Post-ovulation method In this method, a couple waits to have sex until after ovulation. Summary  Contraception, also called birth control, means methods or devices that prevent pregnancy.  Hormonal methods of contraception include implants, injections, pills, patches, vaginal rings, and emergency contraceptives.  Barrier methods of contraception can include female condoms, female condoms, diaphragms, cervical caps, sponges, and spermicides.  There are two types of IUDs (intrauterine devices). An IUD can be put in a woman's uterus to prevent pregnancy  for 3-5 years.  Permanent sterilization can be done through a procedure for males, females, or both.  Natural family planning methods involve not having sex on days when the woman could become pregnant. This information is not intended to replace advice given to you by your health care provider. Make sure you discuss any questions you have with your health care provider. Document Released: 06/14/2005 Document Revised: 07/17/2016 Document Reviewed: 07/17/2016 Elsevier Interactive Patient Education  2018 [employer] Inc[employer]  

## 2018-06-02 NOTE — Progress Notes (Signed)
Pt states is having an odor with no pain nor itching. [facility] wants to discuss the patch or pills.

## 2018-06-05 LAB — CERVICOVAGINAL ANCILLARY ONLY
BACTERIAL VAGINITIS: POSITIVE — AB
CANDIDA VAGINITIS: POSITIVE — AB
CHLAMYDIA, DNA PROBE: NEGATIVE
Neisseria Gonorrhea: POSITIVE — AB
TRICH (WINDOWPATH): NEGATIVE

## 2018-06-06 ENCOUNTER — Encounter (HOSPITAL_COMMUNITY): Payer: Self-pay

## 2018-06-06 ENCOUNTER — Other Ambulatory Visit: Payer: Self-pay

## 2018-06-06 ENCOUNTER — Telehealth: Payer: Self-pay

## 2018-06-06 ENCOUNTER — Ambulatory Visit (INDEPENDENT_AMBULATORY_CARE_PROVIDER_SITE_OTHER): Payer: Medicaid Other

## 2018-06-06 DIAGNOSIS — N760 Acute vaginitis: Secondary | ICD-10-CM

## 2018-06-06 DIAGNOSIS — Z202 Contact with and (suspected) exposure to infections with a predominantly sexual mode of transmission: Secondary | ICD-10-CM

## 2018-06-06 DIAGNOSIS — B3731 Acute candidiasis of vulva and vagina: Secondary | ICD-10-CM

## 2018-06-06 DIAGNOSIS — B373 Candidiasis of vulva and vagina: Secondary | ICD-10-CM

## 2018-06-06 DIAGNOSIS — B9689 Other specified bacterial agents as the cause of diseases classified elsewhere: Secondary | ICD-10-CM

## 2018-06-06 MED ORDER — AZITHROMYCIN 250 MG PO TABS
1000.0000 mg | ORAL_TABLET | Freq: Every day | ORAL | Status: DC
  Administered 2018-06-06: 1000 mg via ORAL

## 2018-06-06 MED ORDER — FLUCONAZOLE 150 MG PO TABS: 150.0000 mg | ORAL_TABLET | Freq: Once | ORAL | 0 refills | Status: AC

## 2018-06-06 MED ORDER — METRONIDAZOLE 500 MG PO TABS: 500.0000 mg | ORAL_TABLET | Freq: Two times a day (BID) | ORAL | 0 refills | Status: DC

## 2018-06-06 MED ORDER — CEFTRIAXONE SODIUM 250 MG IJ SOLR
250.0000 mg | Freq: Once | INTRAMUSCULAR | Status: AC
Start: 2018-06-06 — End: 2018-06-06
  Administered 2018-06-06: 250 mg via INTRAMUSCULAR

## 2018-06-06 MED ORDER — LIDOCAINE HCL (PF) 1 % IJ SOLN
2.0000 mL | Freq: Once | INTRAMUSCULAR | Status: AC
  Administered 2018-06-06: 2 mL via INTRADERMAL

## 2018-06-06 NOTE — Progress Notes (Signed)
Pt came in for TX for Gonorrhea, gave Ceftriaxone injection 250mg  & Azithromycin 4 pills 250mg  each. Advised that the Rx for the yeast & BV was sent to pharmacy on file . Also advised partner needs to be Tx & no sex until 2 weeks after last person Tx. Come back after 4 weeks for TOC. Pt verbalized understanding.

## 2018-06-06 NOTE — Telephone Encounter (Signed)
Called pt regarding wet prep results, no answer, left message for call back on VM. Also sent message via My Chart regarding pt being positive for BV, Yeast & Gonorrhea, sent yeast Rx to pharmacy on file but pt needs to come in for other Tx.

## 2018-06-07 NOTE — Progress Notes (Signed)
STD Card filled out & faxed

## 2018-07-04 ENCOUNTER — Encounter: Payer: Self-pay | Admitting: *Deleted

## 2018-07-05 ENCOUNTER — Encounter: Payer: Self-pay | Admitting: General Practice

## 2018-07-05 ENCOUNTER — Other Ambulatory Visit (HOSPITAL_COMMUNITY)
Admission: RE | Admit: 2018-07-05 | Discharge: 2018-07-05 | Disposition: A | Discharge status: Home / Self Care | Payer: Medicaid Other | Source: Ambulatory Visit | Attending: Medical | Admitting: Medical

## 2018-07-05 ENCOUNTER — Ambulatory Visit (INDEPENDENT_AMBULATORY_CARE_PROVIDER_SITE_OTHER): Payer: Medicaid Other | Admitting: General Practice

## 2018-07-05 VITALS — BP 124/79 | HR 90 | Ht 60.0 in | Wt 143.0 lb

## 2018-07-05 DIAGNOSIS — Z8619 Personal history of other infectious and parasitic diseases: Secondary | ICD-10-CM | POA: Insufficient documentation

## 2018-07-05 NOTE — Progress Notes (Signed)
Patient presents to office today for test of cure following recent + gonorrhea on 12/6. Patient states her and her partner have been treated and have not had intercourse since. Patient instructed in self swab and specimen collected. Discussed we will contact her with any abnormal results. Patient verbalized understanding & had no questions.  Chase Caller RN BSN 07/05/18

## 2018-07-05 NOTE — Progress Notes (Signed)
I have reviewed the chart and agree with nursing staff's documentation of this patient's encounter.  Vonzella Nipple, PA-C 07/05/2018 11:38 AM

## 2018-07-06 LAB — GC/CHLAMYDIA PROBE AMP (~~LOC~~) NOT AT ARMC
CHLAMYDIA, DNA PROBE: NEGATIVE
NEISSERIA GONORRHEA: NEGATIVE

## 2018-08-16 NOTE — Telephone Encounter (Signed)
Error

## 2018-08-28 ENCOUNTER — Ambulatory Visit (INDEPENDENT_AMBULATORY_CARE_PROVIDER_SITE_OTHER): Payer: Medicaid Other | Admitting: Obstetrics and Gynecology

## 2018-08-28 ENCOUNTER — Encounter: Payer: Self-pay | Admitting: Obstetrics and Gynecology

## 2018-08-28 VITALS — BP 129/87 | HR 102 | Wt 137.0 lb

## 2018-08-28 DIAGNOSIS — N939 Abnormal uterine and vaginal bleeding, unspecified: Secondary | ICD-10-CM | POA: Diagnosis not present

## 2018-08-28 NOTE — Progress Notes (Signed)
Patient ID: Alison Stone, female   DOB: [birth year], 33 y.o.   MRN: [id number]   GYNECOLOGY PROGRESS NOTE  History:  33 y.o. [id number] presents to [facility] office today for problem gyn visit. She reports vaginal bleeding since LMP on 08/18/2018. She reports having to change 2-3 pads in a 24 hr period, "but it's not much blood" on the pad when she changes it.  She denies h/a, dizziness, shortness of breath, n/v, or fever/chills.    The following portions of the patient's history were reviewed and updated as appropriate: allergies, current medications, past family history, past medical history, past social history, past surgical history and problem list. Last pap smear on 05/14/2016 was normal.  Review of Systems:  Pertinent items are noted in HPI.   Objective:  Physical Exam Blood pressure 129/87, pulse (!) 102, weight 137 lb (62.1 kg), last menstrual period 08/18/2018.  VS reviewed, nursing note reviewed,  Constitutional: well developed, well nourished, no distress HEENT: normocephalic CV: normal rate Pulm/chest wall: normal effort Breast Exam: deferred Abdomen: soft Neuro: alert and oriented x 3 Skin: warm, dry Psych: affect normal Pelvic exam & Bimanual exam: Deferred  Assessment & Plan:  Abnormal uterine bleeding (AUB) - Reassurance given that spotting/irregular bleeding is not unusual after restarting BC - Advised to expect at least 3-6 months of irregular spotting/bleeding - Advised to take Motrin 600 mg every 6 hrs x 3 days - F/U prn  Raelyn Mora, CNM 6:23 PM

## 2018-10-04 ENCOUNTER — Encounter: Payer: Self-pay | Admitting: *Deleted

## 2021-01-28 DIAGNOSIS — N898 Other specified noninflammatory disorders of vagina: Secondary | ICD-10-CM | POA: Diagnosis not present

## 2021-01-29 DIAGNOSIS — Z113 Encounter for screening for infections with a predominantly sexual mode of transmission: Secondary | ICD-10-CM | POA: Diagnosis not present

## 2021-01-29 DIAGNOSIS — N898 Other specified noninflammatory disorders of vagina: Secondary | ICD-10-CM | POA: Diagnosis not present

## 2021-06-18 ENCOUNTER — Encounter: Payer: Self-pay | Admitting: General Practice

## 2022-12-13 ENCOUNTER — Other Ambulatory Visit (HOSPITAL_COMMUNITY): Payer: Self-pay

## 2023-03-17 ENCOUNTER — Telehealth: Payer: Self-pay

## 2023-03-17 NOTE — Telephone Encounter (Signed)
..  Medicaid Managed Care   Unsuccessful Outreach Note  03/17/2023 Name: Alison Stone MRN: [id number] DOB: 1985/10/22  Referred by: Patient, No Pcp Per Reason for referral : No chief complaint on file.   An unsuccessful telephone outreach was attempted today. The patient was referred to the case management team for assistance with care management and care coordination.   Follow Up Plan: If patient returns call to provider office, please advise to call Embedded Care Management Care Guide Nicholes Rough* at [phone]*  Nicholes Rough, CMA Care Guide [facility] [employer]

## 2023-06-14 ENCOUNTER — Ambulatory Visit
Admission: RE | Admit: 2023-06-14 | Discharge: 2023-06-14 | Disposition: A | Discharge status: Home / Self Care | Payer: Medicaid Other | Source: Ambulatory Visit | Attending: Physician Assistant | Admitting: Physician Assistant

## 2023-06-14 VITALS — BP 121/83 | HR 93 | Temp 98.3°F | Resp 18 | Ht 62.0 in | Wt 136.9 lb

## 2023-06-14 DIAGNOSIS — N3000 Acute cystitis without hematuria: Secondary | ICD-10-CM | POA: Diagnosis not present

## 2023-06-14 DIAGNOSIS — Z113 Encounter for screening for infections with a predominantly sexual mode of transmission: Secondary | ICD-10-CM | POA: Diagnosis not present

## 2023-06-14 DIAGNOSIS — R300 Dysuria: Secondary | ICD-10-CM | POA: Diagnosis present

## 2023-06-14 DIAGNOSIS — N760 Acute vaginitis: Secondary | ICD-10-CM | POA: Diagnosis not present

## 2023-06-14 LAB — POCT URINALYSIS DIP (MANUAL ENTRY)
Bilirubin, UA: NEGATIVE
Blood, UA: NEGATIVE
Glucose, UA: NEGATIVE mg/dL
Ketones, POC UA: NEGATIVE mg/dL
Leukocytes, UA: NEGATIVE
Nitrite, UA: POSITIVE — AB
Protein Ur, POC: NEGATIVE mg/dL
Spec Grav, UA: 1.015 (ref 1.010–1.025)
Urobilinogen, UA: 0.2 U/dL
pH, UA: 7 (ref 5.0–8.0)

## 2023-06-14 MED ORDER — DOXYCYCLINE HYCLATE 100 MG PO CAPS: 100.0000 mg | ORAL_CAPSULE | Freq: Two times a day (BID) | ORAL | 0 refills | Status: AC

## 2023-06-14 MED ORDER — METRONIDAZOLE 500 MG/5ML PO SUSP: 500.0000 mg | Freq: Two times a day (BID) | ORAL | 0 refills | Status: AC

## 2023-06-14 NOTE — ED Triage Notes (Signed)
"  I feel like my urine has a bad odor too". No dysuria.

## 2023-06-14 NOTE — ED Triage Notes (Signed)
"  I am having vaginal issue or itching/discomfort, I was prescribed medication for it but couldn't finish it". No discharge.   Last visit and treatment: [facility] (Last year, month unknown). Treatment for "Trichomonas possibly, medication unable to take possibly Flagyl". "I need this testing and/or treatment again".

## 2023-06-15 LAB — CERVICOVAGINAL ANCILLARY ONLY
Bacterial Vaginitis (gardnerella): NEGATIVE
Candida Glabrata: NEGATIVE
Candida Vaginitis: NEGATIVE
Chlamydia: NEGATIVE
Comment: NEGATIVE
Comment: NEGATIVE
Comment: NEGATIVE
Comment: NEGATIVE
Comment: NEGATIVE
Comment: NORMAL
Neisseria Gonorrhea: NEGATIVE
Trichomonas: NEGATIVE

## 2023-06-16 LAB — URINE CULTURE: Culture: >=100000 — AB

## 2023-06-17 ENCOUNTER — Telehealth (HOSPITAL_COMMUNITY): Payer: Self-pay

## 2023-06-17 MED ORDER — NITROFURANTOIN MONOHYD MACRO 100 MG PO CAPS: 100.0000 mg | ORAL_CAPSULE | Freq: Two times a day (BID) | ORAL | 0 refills | Status: AC

## 2023-06-17 NOTE — Telephone Encounter (Signed)
Per Billee Cashing, PA-C, "ok to change to macrobid and d/c doxycyline and flagyl." Macrobid sent per protocol dosing. Attempted to reach patient x1. LVM.  Rx sent to pharmacy on file.

## 2023-06-23 NOTE — ED Provider Notes (Signed)
[facility]    CSN: [id number] Arrival date & time: 06/14/23  0855      History   Chief Complaint Chief Complaint  Patient presents with   Vaginal Itching    Entered by patient    HPI Alison Stone is a 37 y.o. female.   Patient here today for evaluation of vaginal itching and discomfort.  She reports that she was prescribed medication for trichomonas but did not finish due to side effects.  She reports no vaginal discharge that she is aware of.  She does report some dysuria.  The history is provided by the patient.  Vaginal Itching Pertinent negatives include no abdominal pain and no shortness of breath.    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, no complications     Patient Active Problem List   Diagnosis Date Noted   NSVD (normal spontaneous vaginal delivery) 01/22/2017   History of gestational diabetes 12/06/2016   Iron deficiency anemia during pregnancy 10/13/2016   Supervision of high risk pregnancy, antepartum, third trimester 10/11/2016   Peritonsillar abscess 08/12/2015    Past Surgical History:  Procedure Laterality Date   THROAT SURGERY     cyst blocking airway    OB History     Gravida  6   Para  5   Term  5   Preterm  0   AB  1   Living  5      SAB  1   IAB  0   Ectopic  0   Multiple  0   Live Births  5            Home Medications    Prior to Admission medications   Medication Sig Start Date End Date Taking? Authorizing Provider  acetaminophen (TYLENOL) 500 MG tablet Take 1,000 mg by mouth every 6 (six) hours as needed for headache.   Yes [provider]  SODIUM FLUORIDE, DENTAL RINSE, 0.2 % SOLN Take by mouth as directed. 03/01/23  Yes [provider]  SODIUM FLUORIDE, DENTAL RINSE, 0.2 % SOLN Take by mouth as directed. 06/06/23  Yes [provider]  Calcium Carbonate-Vitamin D  (CALTRATE COLON HEALTH PO) Take by mouth.    [provider]  ibuprofen (ADVIL,MOTRIN) 800 MG tablet Take 1 tablet (800 mg total) by mouth every 8 (eight) hours as needed. Patient not taking: Reported on 08/28/2018 01/23/17   Conard Novak, MD  nitrofurantoin, macrocrystal-monohydrate, (MACROBID) 100 MG capsule Take 1 capsule (100 mg total) by mouth 2 (two) times daily. 06/17/23   LampteyBritta Mccreedy, MD  norelgestromin-ethinyl estradiol (ORTHO EVRA) 150-35 MCG/24HR transdermal patch Place 1 patch onto the skin once a week. 06/02/18   Judeth Horn, NP  Prenatal MV & Min w/FA-DHA (PRENATAL ADULT GUMMY/DHA/FA PO) Take 2 tablets by mouth daily.    [provider]    Family History Family History  Problem Relation Age of Onset   Stroke Maternal Grandfather    Hypertension Maternal Grandfather    Heart failure Maternal Grandfather    Asthma Maternal Aunt    Kidney disease Maternal Aunt    Migraines Maternal Aunt    Nephrolithiasis Paternal Aunt    Migraines Paternal Aunt     Social History Social History   Tobacco Use   Smoking status: Never   Smokeless tobacco: Never  Vaping Use   Vaping  status: Never Used  Substance Use Topics   Alcohol use: No    Alcohol/week: 0.0 standard drinks of alcohol   Drug use: No     Allergies   Patient has no known allergies.   Review of Systems Review of Systems  Constitutional:  Negative for chills and fever.  Eyes:  Negative for discharge and redness.  Respiratory:  Negative for shortness of breath.   Gastrointestinal:  Negative for abdominal pain, nausea and vomiting.  Genitourinary:  Positive for dysuria. Negative for vaginal bleeding and vaginal discharge.     Physical Exam Triage Vital Signs ED Triage Vitals  Encounter Vitals Group     BP 06/14/23 1014 121/83     Systolic BP Percentile --      Diastolic BP Percentile --      Pulse Rate 06/14/23 1014 93     Resp 06/14/23 1014 18     Temp 06/14/23 1014 98.3  F (36.8 C)     Temp Source 06/14/23 1014 Oral     SpO2 06/14/23 1014 99 %     Weight 06/14/23 1013 136 lb 14.5 oz (62.1 kg)     Height 06/14/23 1013 5\' 2"  (1.575 m)     Head Circumference --      Peak Flow --      Pain Score 06/14/23 1013 0     Pain Loc --      Pain Education --      Exclude from Growth Chart --    No data found.  Updated Vital Signs BP 121/83 (BP Location: Left Arm)   Pulse 93   Temp 98.3 F (36.8 C) (Oral)   Resp 18   Ht 5\' 2"  (1.575 m)   Wt 136 lb 14.5 oz (62.1 kg)   LMP 06/07/2023 (Exact Date)   SpO2 99%   BMI 25.04 kg/m   Visual Acuity Right Eye Distance:   Left Eye Distance:   Bilateral Distance:    Right Eye Near:   Left Eye Near:    Bilateral Near:     Physical Exam Vitals and nursing note reviewed.  Constitutional:      General: She is not in acute distress.    Appearance: Normal appearance. She is not ill-appearing.  HENT:     Head: Normocephalic and atraumatic.  Eyes:     Conjunctiva/sclera: Conjunctivae normal.  Cardiovascular:     Rate and Rhythm: Normal rate.  Pulmonary:     Effort: Pulmonary effort is normal. No respiratory distress.  Neurological:     Mental Status: She is alert.  Psychiatric:        Mood and Affect: Mood normal.        Behavior: Behavior normal.        Thought Content: Thought content normal.      UC Treatments / Results  Labs (all labs ordered are listed, but only abnormal results are displayed) Labs Reviewed  URINE CULTURE - Abnormal; Notable for the following components:      Result Value   Culture >=100,000 COLONIES/mL ESCHERICHIA COLI (*)    Organism ID, Bacteria ESCHERICHIA COLI (*)    All other components within normal limits  POCT URINALYSIS DIP (MANUAL ENTRY) - Abnormal; Notable for the following components:   Color, UA light yellow (*)    Clarity, UA cloudy (*)    Nitrite, UA Positive (*)    All other components within normal limits  CERVICOVAGINAL ANCILLARY ONLY     EKG   Radiology  No results found.  Procedures Procedures (including critical care time)  Medications Ordered in UC Medications - No data to display  Initial Impression / Assessment and Plan / UC Course  I have reviewed the triage vital signs and the nursing notes.  Pertinent labs & imaging results that were available during my care of the patient were reviewed by me and considered in my medical decision making (see chart for details).    Will treat with metronidazole given suspected failure of prior treatment as patient did not complete as prescribed.  Will also prescribe doxycycline.  Urine culture ordered along with STD screening.  Will await results further recommendation.  Final Clinical Impressions(s) / UC Diagnoses   Final diagnoses:  Acute cystitis without hematuria  Acute vaginitis   Discharge Instructions   None    ED Prescriptions     Medication Sig Dispense Auth. Provider   metroNIDAZOLE 500 MG/5ML SUSP Take 500 mg by mouth in the morning and at bedtime for 7 days. 80 mL Erma Pinto F, PA-C   doxycycline (VIBRAMYCIN) 100 MG capsule Take 1 capsule (100 mg total) by mouth 2 (two) times daily for 7 days. 14 capsule Tomi Bamberger, PA-C      PDMP not reviewed this encounter.   Tomi Bamberger, PA-C 06/23/23 1215
